# Patient Record
Sex: Male | Born: 1935
Health system: Southern US, Community
[De-identification: ages and names within clinical notes are randomized; demographics above are authoritative.]

## PROBLEM LIST (undated history)

## (undated) DIAGNOSIS — F419 Anxiety disorder, unspecified: Secondary | ICD-10-CM

## (undated) DIAGNOSIS — F32A Depression, unspecified: Secondary | ICD-10-CM

## (undated) DIAGNOSIS — F329 Major depressive disorder, single episode, unspecified: Secondary | ICD-10-CM

## (undated) DIAGNOSIS — N189 Chronic kidney disease, unspecified: Secondary | ICD-10-CM

## (undated) DIAGNOSIS — M1990 Unspecified osteoarthritis, unspecified site: Secondary | ICD-10-CM

---

## 2004-10-24 HISTORY — PX: EYE SURGERY: SHX253

## 2009-07-29 ENCOUNTER — Encounter: Admission: RE | Admit: 2009-07-29 | Discharge: 2009-07-29 | Payer: Self-pay | Admitting: Emergency Medicine

## 2009-12-18 ENCOUNTER — Encounter: Admission: RE | Admit: 2009-12-18 | Discharge: 2009-12-18 | Payer: Self-pay | Admitting: Emergency Medicine

## 2009-12-21 ENCOUNTER — Encounter: Admission: RE | Admit: 2009-12-21 | Discharge: 2009-12-21 | Payer: Self-pay | Admitting: Emergency Medicine

## 2010-02-08 ENCOUNTER — Encounter: Admission: RE | Admit: 2010-02-08 | Discharge: 2010-02-08 | Payer: Self-pay | Admitting: Emergency Medicine

## 2010-11-15 ENCOUNTER — Encounter: Payer: Self-pay | Admitting: Emergency Medicine

## 2011-11-25 ENCOUNTER — Ambulatory Visit
Admission: RE | Admit: 2011-11-25 | Discharge: 2011-11-25 | Disposition: A | Discharge status: Home / Self Care | Payer: Medicare Other | Source: Ambulatory Visit | Attending: Family Medicine | Admitting: Family Medicine

## 2011-11-25 ENCOUNTER — Other Ambulatory Visit: Payer: Self-pay | Admitting: Family Medicine

## 2011-11-25 DIAGNOSIS — M25579 Pain in unspecified ankle and joints of unspecified foot: Secondary | ICD-10-CM

## 2012-05-28 ENCOUNTER — Ambulatory Visit
Admission: RE | Admit: 2012-05-28 | Discharge: 2012-05-28 | Disposition: A | Discharge status: Home / Self Care | Payer: Medicare Other | Source: Ambulatory Visit | Attending: Family Medicine | Admitting: Family Medicine

## 2012-05-28 ENCOUNTER — Other Ambulatory Visit: Payer: Self-pay | Admitting: Family Medicine

## 2012-05-28 DIAGNOSIS — M545 Low back pain: Secondary | ICD-10-CM

## 2012-10-25 ENCOUNTER — Other Ambulatory Visit: Payer: Self-pay | Admitting: Family Medicine

## 2012-10-25 DIAGNOSIS — E041 Nontoxic single thyroid nodule: Secondary | ICD-10-CM

## 2012-11-30 ENCOUNTER — Ambulatory Visit
Admission: RE | Admit: 2012-11-30 | Discharge: 2012-11-30 | Disposition: A | Discharge status: Home / Self Care | Payer: Medicare Other | Source: Ambulatory Visit | Attending: Family Medicine | Admitting: Family Medicine

## 2012-11-30 ENCOUNTER — Other Ambulatory Visit: Payer: Self-pay | Admitting: Family Medicine

## 2012-11-30 DIAGNOSIS — R310 Gross hematuria: Secondary | ICD-10-CM

## 2012-12-03 ENCOUNTER — Ambulatory Visit
Admission: RE | Admit: 2012-12-03 | Discharge: 2012-12-03 | Disposition: A | Discharge status: Home / Self Care | Payer: Medicare Other | Source: Ambulatory Visit | Attending: Family Medicine | Admitting: Family Medicine

## 2012-12-14 ENCOUNTER — Encounter (HOSPITAL_COMMUNITY): Payer: Self-pay | Admitting: *Deleted

## 2012-12-14 ENCOUNTER — Other Ambulatory Visit: Payer: Self-pay | Admitting: Urology

## 2012-12-14 NOTE — Pre-Procedure Instructions (Signed)
Asked to bring blue folder the day of the procedure,insurance card,I.D. driver's license,wear comfortable clothing and have a driver for the day. Asked not to take Advil,Motrin,Ibuprofen,Aleve or any NSAIDS, Aspirin, or Toradol for 72 hours prior to procedure,  No vitamins or herbal medications 7 days prior to procedure. Will stop Aspirin. Instructed to take laxative per doctor's office instructions and eat a light dinner the evening before procedure.   To arrive at 1530  for lithotripsy procedure.

## 2012-12-26 ENCOUNTER — Encounter (HOSPITAL_COMMUNITY): Payer: Self-pay | Admitting: Pharmacy Technician

## 2013-01-02 NOTE — H&P (Signed)
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 Leslee Home. His PSA was 81 prompting a repeat PSA which was 102. He  underwent a prostate biopsy by Dr. Karoline Caldwell in [place] and was diagnosed with clinical stage T1c, Gleason 4+4 = 8 adenocarcinoma the prostate. At that time, his staging studies including a bone scan and CT scan were negative for metastatic prostate cancer. He did have an anterior mediastinal mass and thyroid nodule noted on his workup and underwent further evaluation including a biopsy which was nondiagnostic. It was not felt that this was likely malignant and he has been undergoing observation for these lesions. Due to his high-risk prostate cancer, he did elect to proceed with treatment.  He was treated with IMRT (75.6 Gy) by Dr. Verner Mould in [place] from November 2010 until January 2011. He also received androgen deprivation therapy with Lupron beginning in the summer of 2010 up until October 2012. He did have radiation proctitis acutely which subsequently resolved. He did have significant side effects related to androgen deprivation including a 40 pound weight gain, fatigue, and cognitive decline. His PSA did become undetectable following therapy. He elected to establish care with me for ongoing urologic followup in March 2013. After discussion, he elected to discontinue ADT at that time due to the side effects he was having.  2) Testosterone deficiency/bone health:  Current treatment: Vitamind D and calcium Last DEXA:  June 2013 (osteopenia)  3) BPH/LUTS: He has baseline urinary symptoms including urinary frequency, urgency, and hesitancy. The symptoms did progress slightly after radiation therapy. Current treatment: Tamsulosin  0.4 mg daily  Interval history:  He follows up today with a new complaint of a ureteral calculus. He apparently did have one kidney stone approximately 12 years ago which he was able to pass spontaneously. He presented to Dr. Duaine Dredge last week after developing gross hematuria. He denied any pain symptoms or fever. He underwent a CT scan which did demonstrate a 6 mm distal left ureteral calculus without hydronephrosis. He is since had no pain symptoms and again denies any fever, recurrent hematuria, or lower urinary tract symptoms.   Past Medical History Problems  1. History of  Arthritis V13.4 2. History of  Depression 311 3. History of  Hypercholesterolemia 272.0 4. History of  Prostate Cancer V10.46  Surgical History Problems  1. History of  Complete Colonoscopy 2. History of  Wrist Arthroscopy With Release Of Transverse Carpal Ligament Left 3. History of  Wrist Arthroscopy With Release Of Transverse Carpal Ligament Right  Current Meds 1. BuPROPion HCl ER (XL) 300 MG Oral Tablet Extended Release 24 Hour; Therapy: 06Jun2013 to 2. Fexofenadine HCl TABS; Therapy: (Recorded:05Mar2013) to 3. Hydrocodone-Acetaminophen 5-325 MG Oral Tablet; TAKE 1 TO 2 TABLETS EVERY 4 TO 6  HOURS AS NEEDED FOR PAIN.  NO MORE THAN 8 TABLETS PER DAY; Therapy: 11Feb2014 to  (Complete:14Feb2014); Last Rx:11Feb2014 4. Simvastatin TABS; Therapy: (Recorded:05Mar2013) to 5. Tamsulosin HCl 0.4 MG Oral Capsule; Therapy: (Recorded:05Mar2013) to 6. Vitamin D 1000 UNIT Oral Tablet; Therapy: (Recorded:05Mar2013) to 7. Zinc 50 MG Oral Tablet; Therapy: (Recorded:05Mar2013) to  Allergies Medication  1. No Known Drug Allergies  Family History Problems  1. Paternal history of  Nephrolithiasis 2. Paternal history of  Prostate Cancer V16.42  3. Paternal history of  Stroke Syndrome V17.1  Social History Problems  1. Marital History - Currently Married 2. Never A Smoker Denied  3. History of  Alcohol Use 4. History  of  Tobacco Use  Review of Systems  Genitourinary: hematuria.  Gastrointestinal: no nausea and no vomiting.  Constitutional: no fever.    Vitals Vital Signs [Data Includes: Last 1 Day]  18Feb2014 02:15PM  Blood Pressure: 162 / 78 Temperature: 98.4 F Heart Rate: 90  Physical Exam Constitutional: Well nourished and well developed . No acute distress.  ENT:. The ears and nose are normal in appearance.  Neck: The appearance of the neck is normal and no neck mass is present.  Pulmonary: No respiratory distress and normal respiratory rhythm and effort.  Cardiovascular:. No peripheral edema.  Abdomen: The abdomen is soft and nontender. No CVA tenderness.  Skin: Normal skin turgor, no visible rash and no visible skin lesions.  Neuro/Psych:. Mood and affect are appropriate.    Results/Data Urine [Data Includes: Last 1 Day]   18Feb2014  COLOR YELLOW   APPEARANCE CLEAR   SPECIFIC GRAVITY 1.010   pH 6.0   GLUCOSE NEG mg/dL  BILIRUBIN NEG   KETONE NEG mg/dL  BLOOD NEG   PROTEIN NEG mg/dL  UROBILINOGEN 0.2 mg/dL  NITRITE NEG   LEUKOCYTE ESTERASE NEG     I independently reviewed his medical records as well as his recent CT scan of the abdomen and pelvis from 12/03/12. This demonstrates a 6 mm distal left ureteral calculus without hydronephrosis.  I also independently reviewed his KUB x-ray today as well as a scout film from his prior CT scan. This does demonstrate persistent 6 mm calcification in the vicinity of the distal left ureter consistent with his known stone. Hounsfield units measurements are approximately 300 from his CT scan.   Plan Health Maintenance (V70.0)  1. UA With REFLEX  Done: 18Feb2014 01:50PM  Discussion/Summary  1. Left ureteral calculus: He currently is asymptomatic and without hydronephrosis. He will be managed with observation and medical expulsion therapy. He is tamsulosin at home which had previously taken for other purposes and will restart this medication  once nightly. He has been instructed to strain his urine and to notify me if he develops fever, severe pain, or nausea/vomiting. He also has been provided hydrocodone to take as needed if he develops pain. If he is unable to pass the stone over the next 3-4 weeks, we have discussed proceeding with definitive treatment. We discussed options including shockwave lithotripsy versus ureteroscopic therapy. After reviewing the pros and cons of each approach, he is most interested in tentatively been scheduled for shockwave lithotripsy. He understands the potential risks including but not limited to bleeding, infection, failure to fragment the stone, need for further procedures, et Karie Soda. He gives his informed consent to proceed in this fashion. He will notify me if he passes the stone prior to his planned procedure.  2. Prostate cancer: He will keep his regularly scheduled followup for surveillance in the spring.  3. BPH/LUTS: His PVR will be checked at his next scheduled visit.  Cc: Dr. Mosetta Putt     Signatures Electronically signed by : Heloise Purpura, M.D.; Dec 11 2012  7:08PM

## 2013-01-03 ENCOUNTER — Encounter (HOSPITAL_COMMUNITY)
Admission: RE | Disposition: A | Discharge status: Home / Self Care | Payer: Self-pay | Source: Ambulatory Visit | Attending: Urology

## 2013-01-03 ENCOUNTER — Ambulatory Visit (HOSPITAL_COMMUNITY)
Admission: RE | Admit: 2013-01-03 | Discharge: 2013-01-03 | Disposition: A | Discharge status: Home / Self Care | Payer: Medicare Other | Source: Ambulatory Visit | Attending: Urology | Admitting: Urology

## 2013-01-03 ENCOUNTER — Encounter (HOSPITAL_COMMUNITY): Payer: Self-pay | Admitting: *Deleted

## 2013-01-03 ENCOUNTER — Ambulatory Visit (HOSPITAL_COMMUNITY): Payer: Medicare Other

## 2013-01-03 DIAGNOSIS — N138 Other obstructive and reflux uropathy: Secondary | ICD-10-CM | POA: Insufficient documentation

## 2013-01-03 DIAGNOSIS — N401 Enlarged prostate with lower urinary tract symptoms: Secondary | ICD-10-CM | POA: Insufficient documentation

## 2013-01-03 DIAGNOSIS — N201 Calculus of ureter: Secondary | ICD-10-CM | POA: Insufficient documentation

## 2013-01-03 DIAGNOSIS — Z8546 Personal history of malignant neoplasm of prostate: Secondary | ICD-10-CM | POA: Insufficient documentation

## 2013-01-03 DIAGNOSIS — Z79899 Other long term (current) drug therapy: Secondary | ICD-10-CM | POA: Insufficient documentation

## 2013-01-03 DIAGNOSIS — E7800 Pure hypercholesterolemia, unspecified: Secondary | ICD-10-CM | POA: Insufficient documentation

## 2013-01-03 DIAGNOSIS — I10 Essential (primary) hypertension: Secondary | ICD-10-CM | POA: Insufficient documentation

## 2013-01-03 HISTORY — DX: Anxiety disorder, unspecified: F41.9

## 2013-01-03 HISTORY — DX: Chronic kidney disease, unspecified: N18.9

## 2013-01-03 HISTORY — DX: Unspecified osteoarthritis, unspecified site: M19.90

## 2013-01-03 HISTORY — DX: Major depressive disorder, single episode, unspecified: F32.9

## 2013-01-03 HISTORY — DX: Depression, unspecified: F32.A

## 2013-01-03 SURGERY — LITHOTRIPSY, ESWL
Anesthesia: LOCAL | Laterality: Left

## 2013-01-03 MED ORDER — CIPROFLOXACIN HCL 500 MG PO TABS
500.0000 mg | ORAL_TABLET | ORAL | Status: AC
  Administered 2013-01-03: 500 mg via ORAL
  Filled 2013-01-03: qty 1

## 2013-01-03 MED ORDER — DIPHENHYDRAMINE HCL 25 MG PO CAPS
25.0000 mg | ORAL_CAPSULE | ORAL | Status: AC
  Administered 2013-01-03: 25 mg via ORAL
  Filled 2013-01-03: qty 1

## 2013-01-03 MED ORDER — DIAZEPAM 5 MG PO TABS
10.0000 mg | ORAL_TABLET | ORAL | Status: AC
  Administered 2013-01-03: 10 mg via ORAL
  Filled 2013-01-03: qty 2

## 2013-01-03 MED ORDER — DEXTROSE-NACL 5-0.45 % IV SOLN
INTRAVENOUS | Status: DC
  Administered 2013-01-03: 16:00:00 via INTRAVENOUS

## 2013-01-03 NOTE — Interval H&P Note (Signed)
History and Physical Interval Note:  01/03/2013 6:40 PM  Jesse Hampton  has presented today for surgery, with the diagnosis of Left Distal Ureteral Calculus  The various methods of treatment have been discussed with the patient and family. After consideration of risks, benefits and other options for treatment, the patient has consented to  Procedure(s) with comments: EXTRACORPOREAL SHOCK WAVE LITHOTRIPSY (ESWL) (Left) - 75 mins requested for this case  [phone] home # [phone] cell # [facility] MEDICARE STATE   as a surgical intervention .  The patient's history has been reviewed, patient examined, no change in status, stable for surgery.  I have reviewed the patient's chart and labs.  Questions were answered to the patient's satisfaction.     BORDEN,LES

## 2013-01-03 NOTE — Op Note (Signed)
Please see written chart for operative note.

## 2013-01-03 NOTE — Progress Notes (Signed)
Left groin is reddened with out breakdown s/p ESWL.

## 2013-01-30 ENCOUNTER — Other Ambulatory Visit: Payer: Self-pay | Admitting: Urology

## 2013-04-01 ENCOUNTER — Other Ambulatory Visit: Payer: Medicare Other

## 2013-06-10 ENCOUNTER — Ambulatory Visit (HOSPITAL_COMMUNITY): Admission: RE | Admit: 2013-06-10 | Payer: Medicare Other | Source: Ambulatory Visit | Admitting: Urology

## 2013-06-10 ENCOUNTER — Encounter (HOSPITAL_COMMUNITY): Admission: RE | Payer: Self-pay | Source: Ambulatory Visit

## 2013-06-10 SURGERY — CYSTOSCOPY/RETROGRADE/URETEROSCOPY
Anesthesia: Choice | Laterality: Left

## 2013-08-06 ENCOUNTER — Other Ambulatory Visit: Payer: Self-pay | Admitting: Family Medicine

## 2013-08-07 ENCOUNTER — Other Ambulatory Visit: Payer: Self-pay | Admitting: Family Medicine

## 2013-08-07 DIAGNOSIS — R9389 Abnormal findings on diagnostic imaging of other specified body structures: Secondary | ICD-10-CM

## 2013-08-13 ENCOUNTER — Ambulatory Visit
Admission: RE | Admit: 2013-08-13 | Discharge: 2013-08-13 | Disposition: A | Discharge status: Home / Self Care | Payer: Medicare Other | Source: Ambulatory Visit | Attending: Family Medicine | Admitting: Family Medicine

## 2013-08-13 DIAGNOSIS — R935 Abnormal findings on diagnostic imaging of other abdominal regions, including retroperitoneum: Secondary | ICD-10-CM

## 2013-08-13 MED ORDER — IOHEXOL 350 MG/ML SOLN
100.0000 mL | Freq: Once | INTRAVENOUS | Status: AC | PRN
  Administered 2013-08-13: 100 mL via INTRAVENOUS

## 2014-11-13 ENCOUNTER — Ambulatory Visit (INDEPENDENT_AMBULATORY_CARE_PROVIDER_SITE_OTHER): Payer: Medicare Other | Admitting: Internal Medicine

## 2014-11-13 ENCOUNTER — Encounter: Payer: Self-pay | Admitting: Internal Medicine

## 2014-11-13 VITALS — BP 128/64 | HR 76 | Temp 97.8°F | Resp 18 | Ht 67.0 in | Wt 155.0 lb

## 2014-11-13 DIAGNOSIS — E785 Hyperlipidemia, unspecified: Secondary | ICD-10-CM

## 2014-11-13 DIAGNOSIS — J302 Other seasonal allergic rhinitis: Secondary | ICD-10-CM

## 2014-11-13 DIAGNOSIS — F32A Depression, unspecified: Secondary | ICD-10-CM

## 2014-11-13 DIAGNOSIS — N400 Enlarged prostate without lower urinary tract symptoms: Secondary | ICD-10-CM

## 2014-11-13 DIAGNOSIS — F329 Major depressive disorder, single episode, unspecified: Secondary | ICD-10-CM

## 2014-11-13 MED ORDER — TAMSULOSIN HCL 0.4 MG PO CAPS: 0.4000 mg | ORAL_CAPSULE | Freq: Every day | ORAL | Status: DC

## 2014-11-13 MED ORDER — BUPROPION HCL ER (XL) 300 MG PO TB24: 300.0000 mg | ORAL_TABLET | Freq: Every morning | ORAL | Status: DC

## 2014-11-13 MED ORDER — MECLIZINE HCL 25 MG PO TABS: 25.0000 mg | ORAL_TABLET | ORAL | Status: DC | PRN

## 2014-11-13 MED ORDER — SIMVASTATIN 20 MG PO TABS: 20.0000 mg | ORAL_TABLET | Freq: Every day | ORAL | Status: DC

## 2014-11-13 NOTE — Patient Instructions (Signed)
We have sent in your refills for your medications, if you have any problems or questions please feel free to call our office.   It was a pleasure meeting you today and I will see you back for your physical later this year.

## 2014-11-13 NOTE — Progress Notes (Signed)
Pre visit review using our clinic review tool, if applicable. No additional management support is needed unless otherwise documented below in the visit note. 

## 2014-11-16 DIAGNOSIS — N400 Enlarged prostate without lower urinary tract symptoms: Secondary | ICD-10-CM | POA: Insufficient documentation

## 2014-11-16 DIAGNOSIS — E785 Hyperlipidemia, unspecified: Secondary | ICD-10-CM | POA: Insufficient documentation

## 2014-11-16 DIAGNOSIS — F325 Major depressive disorder, single episode, in full remission: Secondary | ICD-10-CM | POA: Insufficient documentation

## 2014-11-16 DIAGNOSIS — J309 Allergic rhinitis, unspecified: Secondary | ICD-10-CM | POA: Insufficient documentation

## 2014-11-16 NOTE — Assessment & Plan Note (Signed)
He takes flomax and has done well with this. He still has some urination during the day but less night time symptoms.

## 2014-11-16 NOTE — Assessment & Plan Note (Signed)
He has been taking wellbutrin for some time and does well on it.

## 2014-11-16 NOTE — Assessment & Plan Note (Signed)
He takes allegra when needed for allergies. Not year round.

## 2014-11-16 NOTE — Assessment & Plan Note (Signed)
Will check lipid panel at physical later this year and continue simvastatin in the meantime.

## 2014-11-16 NOTE — Progress Notes (Signed)
   Subjective:    Patient ID: Jesse Hampton, male    DOB: 09/08/1930, 81 y.o. y.o.   MRN: [id number]  HPI The patient is an 81 YO man who is in reasonable health. He does have PMH of depression, BPH, hyperlipidemia, allergic rhinitis. He declines any chest pains, SOB, abdominal pain, constipation, diarrhea. He has been doing well on his regimen for some time and tries to stay current with his health. He knows he has had colon cancer screening in the last 10 years but unsure of the year. He knows he had a pneumonia shot but not sure he had the booster shot. He has no difficulty with his ADLs.   Review of Systems  Constitutional: Negative for fever, activity change, appetite change, fatigue and unexpected weight change.  HENT: Negative.   Respiratory: Negative for cough, chest tightness, shortness of breath and wheezing.   Cardiovascular: Negative for chest pain, palpitations and leg swelling.  Gastrointestinal: Negative for abdominal pain, diarrhea, constipation and abdominal distention.  Musculoskeletal: Negative.   Neurological: Negative.   Psychiatric/Behavioral: Negative.       Objective:   Physical Exam  Constitutional: He is oriented to person, place, and time. He appears well-developed and well-nourished.  HENT:  Head: Normocephalic and atraumatic.  Eyes: EOM are normal.  Neck: Normal range of motion.  Cardiovascular: Normal rate and regular rhythm.   Pulmonary/Chest: Effort normal and breath sounds normal. No respiratory distress. He has no wheezes. He has no rales.  Abdominal: Soft. Bowel sounds are normal. He exhibits no distension. There is no tenderness. There is no rebound.  Musculoskeletal: He exhibits no edema.  Neurological: He is alert and oriented to person, place, and time. Coordination normal.  Skin: Skin is warm and dry.   Filed Vitals:   11/13/14 1102  BP: 128/64  Pulse: 76  Temp: 97.8 F (36.6 C)  TempSrc: Oral  Resp: 18  Height: 5\' 7"  (1.702 m)  Weight:  155 lb (70.308 kg)  SpO2: 97%      Assessment & Plan:

## 2014-12-23 ENCOUNTER — Ambulatory Visit (INDEPENDENT_AMBULATORY_CARE_PROVIDER_SITE_OTHER): Payer: Medicare Other | Admitting: Internal Medicine

## 2014-12-23 VITALS — BP 128/68 | HR 77 | Temp 98.3°F | Resp 16 | Wt 157.0 lb

## 2014-12-23 DIAGNOSIS — L989 Disorder of the skin and subcutaneous tissue, unspecified: Secondary | ICD-10-CM

## 2014-12-23 NOTE — Patient Instructions (Signed)
The names of dermatology groups in town that do a good job:   [facility] [address] [phone]  [facility] [address] [phone]  You can schedule the physical at the front desk on the way out.

## 2014-12-23 NOTE — Progress Notes (Signed)
Pre visit review using our clinic review tool, if applicable. No additional management support is needed unless otherwise documented below in the visit note. 

## 2014-12-25 DIAGNOSIS — L299 Pruritus, unspecified: Secondary | ICD-10-CM | POA: Insufficient documentation

## 2014-12-25 NOTE — Progress Notes (Signed)
   Subjective:    Patient ID: Jesse Hampton, male    DOB: 04/04/1930, 81 y.o.   MRN: [id number]  HPI The patient is an 81 YO man coming in for 2 small skin lesions. They have been present for many years and have not changed significantly recently. One on his right cheek may have grown in the last year. He does not use good sun protection but does not go out much anymore. No discharge. Lesion on his right hand hits against things and bleeds and scabs but does not heal. Has not tried anything on them. Has not sought treatment for them before.   Review of Systems  Constitutional: Negative for fever, activity change, appetite change, fatigue and unexpected weight change.  HENT: Negative.   Respiratory: Negative for cough, chest tightness, shortness of breath and wheezing.   Cardiovascular: Negative for chest pain, palpitations and leg swelling.  Gastrointestinal: Negative for abdominal pain, diarrhea, constipation and abdominal distention.  Musculoskeletal: Negative.   Skin: Positive for color change.  Neurological: Negative.   Psychiatric/Behavioral: Negative.       Objective:   Physical Exam  Constitutional: He is oriented to person, place, and time. He appears well-developed and well-nourished.  HENT:  Head: Normocephalic and atraumatic.  Eyes: EOM are normal.  Neck: Normal range of motion.  Cardiovascular: Normal rate and regular rhythm.   Pulmonary/Chest: Effort normal and breath sounds normal. No respiratory distress. He has no wheezes. He has no rales.  Abdominal: Soft. Bowel sounds are normal. He exhibits no distension. There is no tenderness. There is no rebound.  Musculoskeletal: He exhibits no edema.  Neurological: He is alert and oriented to person, place, and time. Coordination normal.  Skin: Skin is warm and dry.  Lesion on the right cheek, non-cancerous appearing but with irregular borders, no color gradient, not large. Other lesion on the back of the right hand,  chronic irritation and hard to tell if possibly cancerous but more suspicious looking.    Filed Vitals:   12/23/14 1349  BP: 128/68  Pulse: 77  Temp: 98.3 F (36.8 C)  TempSrc: Oral  Resp: 16  Weight: 157 lb (71.215 kg)  SpO2: 96%       Assessment & Plan:

## 2014-12-25 NOTE — Assessment & Plan Note (Signed)
2 small lesions, one on face right side and one on right arm. Recommend he see dermatology to have removed. He wishes to find a dermatologist.

## 2015-02-19 IMAGING — CT CT ABD-PEL WO/W CM
2 of 9 series · 13 of 46 positions shown, 18 images · IV contrast (WATER & [ID] OMNI 350)
Comparison: CT 03/29/2013 at [HOSPITAL]

CLINICAL DATA: Possible pancreatic tail mass

EXAM:
CT ABDOMEN AND PELVIS WITHOUT AND WITH CONTRAST
TECHNIQUE: Multidetector CT imaging of the abdomen and pelvis was performed
without contrast material in one or both body regions, followed by
contrast material(s) and further sections in one or both body
regions.
CONTRAST:  100mL OMNIPAQUE IOHEXOL 350 MG/ML SOLN

[Series 3: arterial/portal venous · axial · arterial · 0.80mm/px · z∈[-350,+0]mm · 10 of 192 slices shown, 15 images]
[im 16/192  soft-tissue]
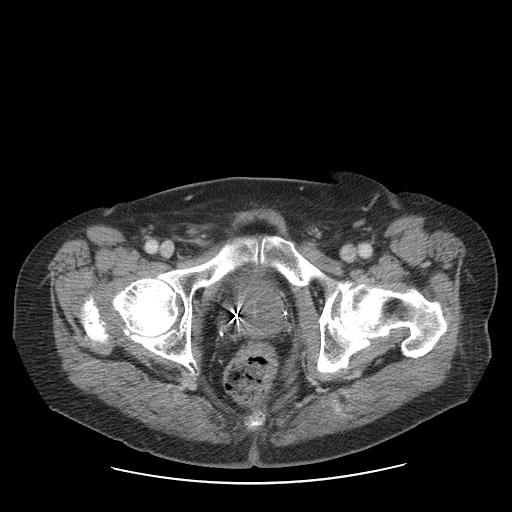
[im 16/192  bone]
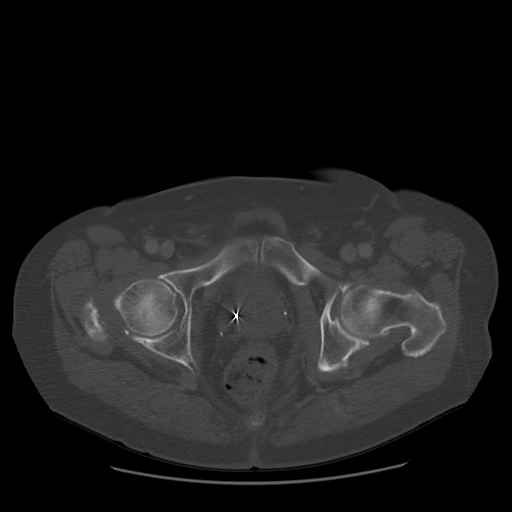
[im 32/192  soft-tissue]
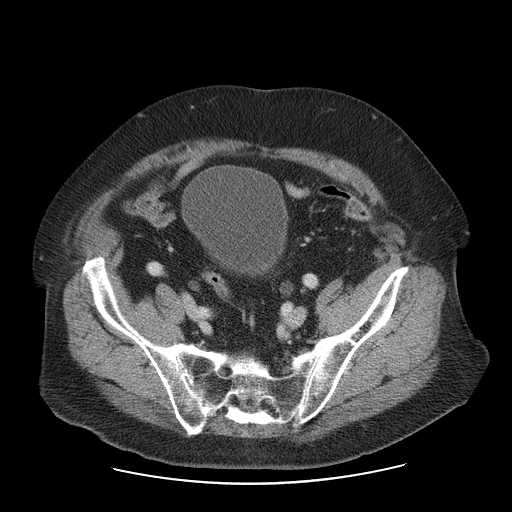
[im 64/192  soft-tissue]
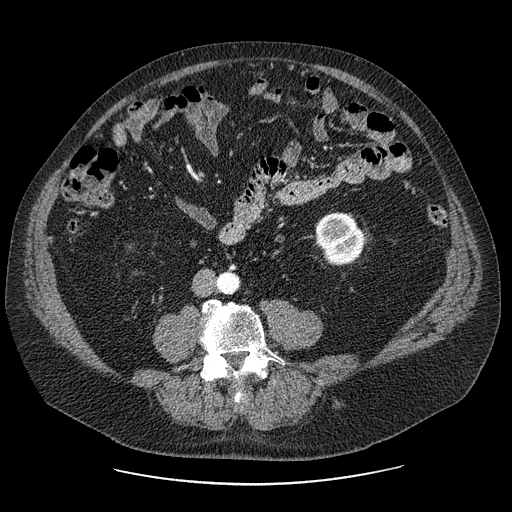
[im 80/192  soft-tissue]
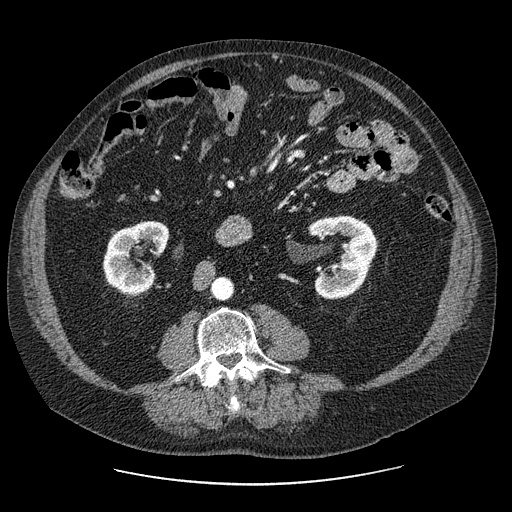
[im 96/192  soft-tissue]
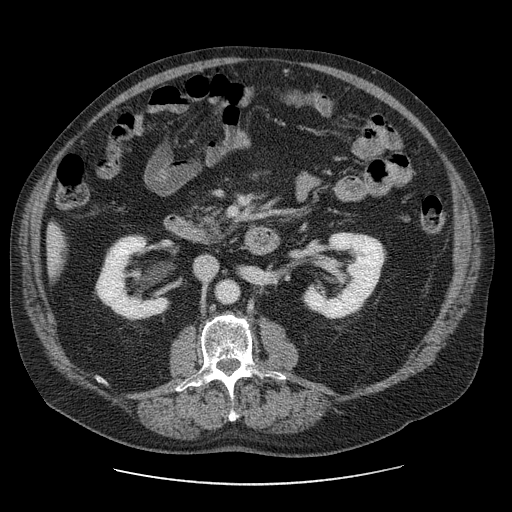
[im 112/192  soft-tissue]
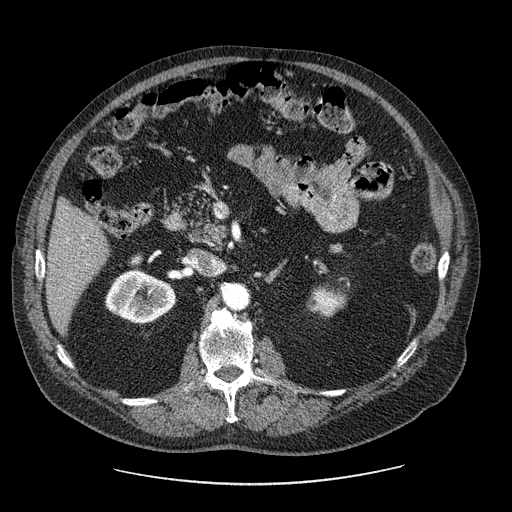
[im 128/192  soft-tissue]
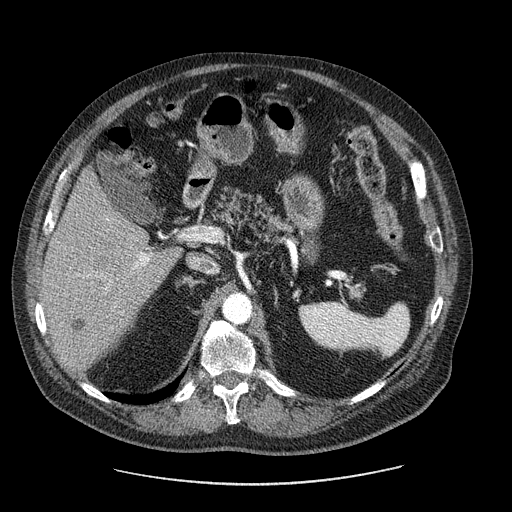
[im 128/192  lung]
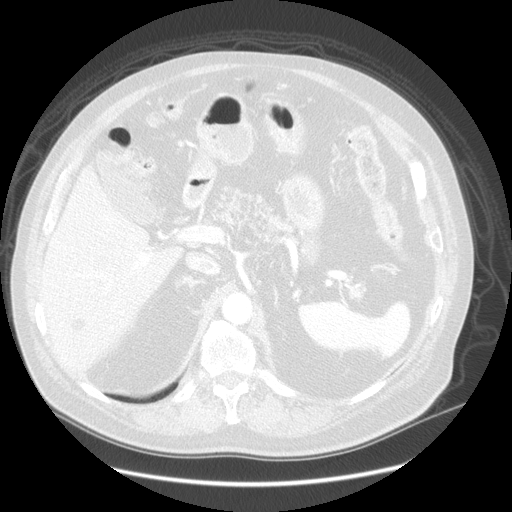
[im 144/192  lung]
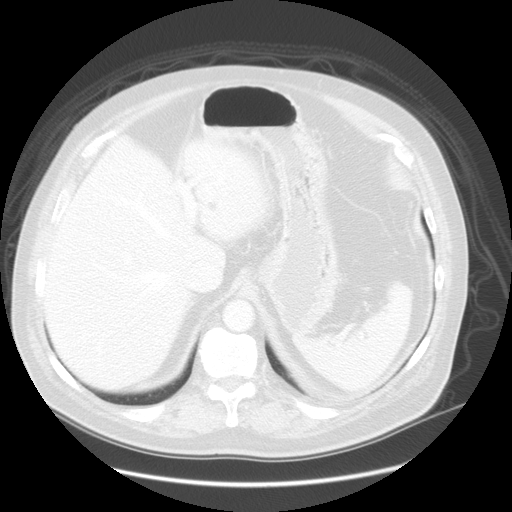
[im 160/192  soft-tissue]
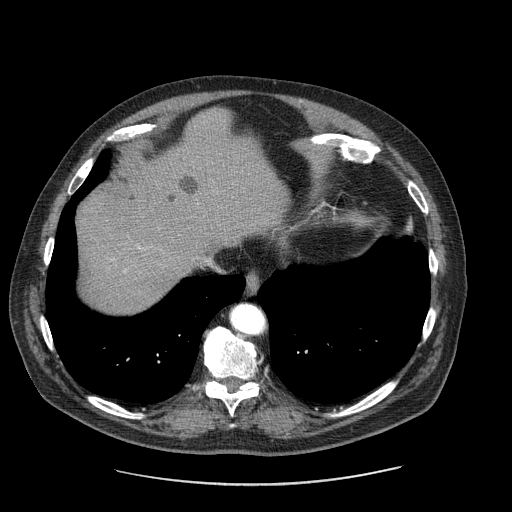
[im 160/192  lung]
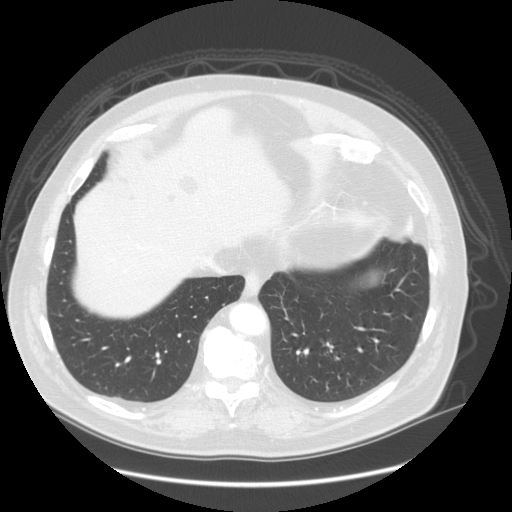
[im 176/192  soft-tissue]
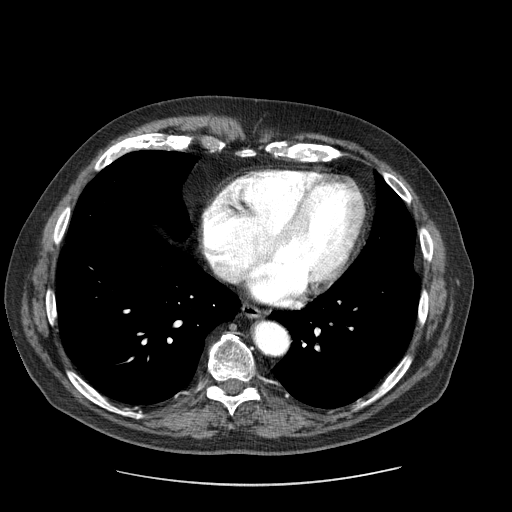
[im 176/192  lung]
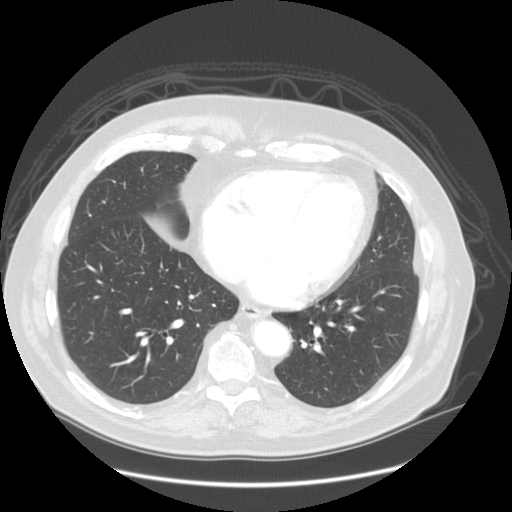
[im 176/192  bone]
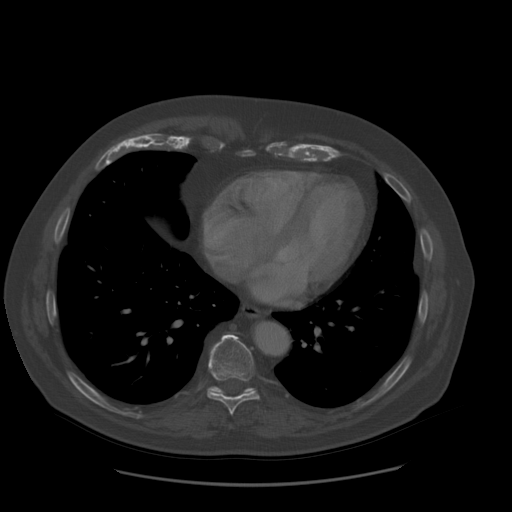

[Series 101: cor art · coronal · 0.80mm/px · 3 of 157 slices shown]
[im 40/157  soft-tissue]
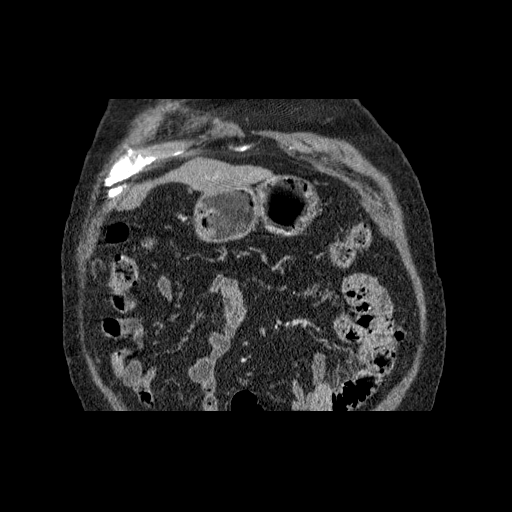
[im 79/157  soft-tissue]
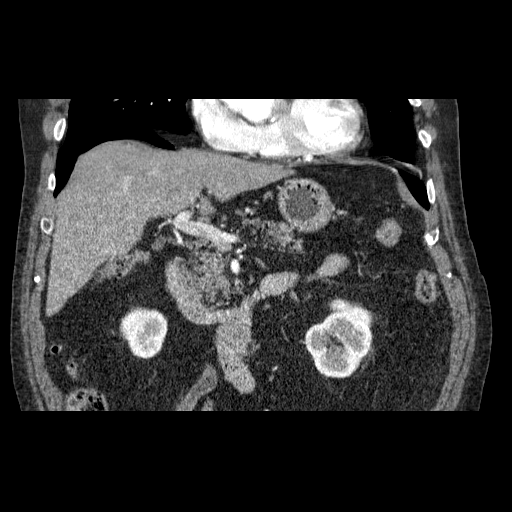
[im 118/157  soft-tissue]
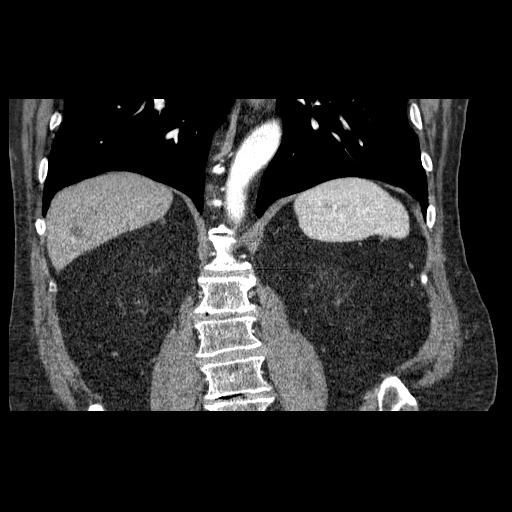

[13 of 46 positions shown; findings below may reference images not displayed]

BUN and creatinine were obtained on site at [HOSPITAL] at

[HOSPITAL].

Results:  BUN 21 mg/dL,  Creatinine 0.8 mg/dL.
FINDINGS: The lung bases are clear. Trace pericardial fluid. 1.5 cm cyst at
the dome of the liver is again noted. Too small to characterize
posterior segment right hepatic lobe and other hepatic hypodensities
most compatible with cysts are also stable. Mild image degradation
due to patient motion. Gallbladder, adrenal glands, spleen, and
right kidney are unremarkable. 2 mm nonobstructing left mid renal
calculus image 33. The pancreas is partly fatty infiltrated. This is
suspect there is asymmetric fatty infiltration most prominent at the
proximal pancreas with relatively less fatty infiltration at the
pancreatic tail. No surrounding stranding or fluid collection is
identified and there is no pancreatic ductal dilatation. The
pancreatic parenchyma enhances homogeneously. Although there is
minimal subjective nodularity to the pancreatic tail, this is felt
to most likely reflect relatively less fatty infiltration that
elsewhere in the pancreas and no measurable mass lesion is
identifiable separate from the pancreatic tissue.

Bowel and bladder are unremarkable. Appendix is normal.
Brachytherapy seeds are noted in the pelvis. Right-sided varicocele
partly visualized. Small fat containing left inguinal hernia. No
lymphadenopathy or ascites. There is mild impression upon the base
of the bladder by the prostate. Minimal asymmetrical wall thickening
is noted at the left aspect of the bladder trigone. 4 mm distal left
ureteral calculus again identified image 138 series 4. There is mild
proximal left ureteral fullness without overt hydronephrosis.

Degenerative changes are noted in the spine. No lytic or sclerotic
osseous lesion or acute osseous abnormality.
IMPRESSION: Although there is nodularity at the tail of the pancreas, this is
felt to most likely reflect asymmetrically decreased fatty
infiltration in this area as compared to the proximal pancreas. No
measurable mass is identified. No significant change from prior
examinations dating back to the least recent similar comparison exam
12/03/2012.

Persistent 4 mm distal left ureteral calculus with mild proximal
hydroureter reidentified, and mild thickening/possible edema at the
left lateral bladder trigone region.

## 2015-03-25 ENCOUNTER — Telehealth: Payer: Self-pay

## 2015-03-25 NOTE — Telephone Encounter (Signed)
Call to the patient and LVM for AWV / left message to see if the patient could come in around 8:45 for his AWV and then he will see Dr. Dorise HissKollar at 9:30am as planned.

## 2015-04-01 NOTE — Telephone Encounter (Signed)
Call back to Mr. Jesse Hampton to fup on VM regarding apt tomorrow with Dr. Dorise HissKollar. The patient stated he would like an AWV; but he did not call back and apt slot was taken. Will come in at scheduled time with Dr. Dorise HissKollar and Dr. Dorise HissKollar may complete the AWV or he can reschedule with me at a later time.

## 2015-04-02 ENCOUNTER — Other Ambulatory Visit (INDEPENDENT_AMBULATORY_CARE_PROVIDER_SITE_OTHER): Payer: Medicare Other

## 2015-04-02 ENCOUNTER — Encounter: Payer: Self-pay | Admitting: Internal Medicine

## 2015-04-02 ENCOUNTER — Ambulatory Visit (INDEPENDENT_AMBULATORY_CARE_PROVIDER_SITE_OTHER): Payer: Medicare Other | Admitting: Internal Medicine

## 2015-04-02 VITALS — BP 130/62 | HR 75 | Temp 98.2°F | Resp 12 | Ht 68.0 in | Wt 163.0 lb

## 2015-04-02 DIAGNOSIS — E041 Nontoxic single thyroid nodule: Secondary | ICD-10-CM | POA: Insufficient documentation

## 2015-04-02 DIAGNOSIS — Z23 Encounter for immunization: Secondary | ICD-10-CM | POA: Diagnosis not present

## 2015-04-02 DIAGNOSIS — F324 Major depressive disorder, single episode, in partial remission: Secondary | ICD-10-CM | POA: Diagnosis not present

## 2015-04-02 DIAGNOSIS — F325 Major depressive disorder, single episode, in full remission: Secondary | ICD-10-CM

## 2015-04-02 DIAGNOSIS — Z299 Encounter for prophylactic measures, unspecified: Secondary | ICD-10-CM

## 2015-04-02 DIAGNOSIS — Z8546 Personal history of malignant neoplasm of prostate: Secondary | ICD-10-CM | POA: Diagnosis not present

## 2015-04-02 DIAGNOSIS — Z418 Encounter for other procedures for purposes other than remedying health state: Secondary | ICD-10-CM

## 2015-04-02 DIAGNOSIS — E785 Hyperlipidemia, unspecified: Secondary | ICD-10-CM

## 2015-04-02 DIAGNOSIS — Z0000 Encounter for general adult medical examination without abnormal findings: Secondary | ICD-10-CM

## 2015-04-02 LAB — COMPREHENSIVE METABOLIC PANEL
ALT: 17 U/L (ref 0–53)
AST: 21 U/L (ref 0–37)
Albumin: 4.4 g/dL (ref 3.5–5.2)
Alkaline Phosphatase: 47 U/L (ref 39–117)
BUN: 25 mg/dL — ABNORMAL HIGH (ref 6–23)
CO2: 31 mEq/L (ref 19–32)
Calcium: 9.1 mg/dL (ref 8.4–10.5)
Chloride: 102 mEq/L (ref 96–112)
Creatinine, Ser: 0.89 mg/dL (ref 0.40–1.50)
GFR: 86.8 mL/min (ref >60.00)
Glucose, Bld: 93 mg/dL (ref 70–99)
Potassium: 4.3 mEq/L (ref 3.5–5.1)
Sodium: 137 mEq/L (ref 135–145)
Total Bilirubin: 0.6 mg/dL (ref 0.2–1.2)
Total Protein: 6.7 g/dL (ref 6.0–8.3)

## 2015-04-02 LAB — LIPID PANEL
Cholesterol: 152 mg/dL (ref 0–200)
HDL: 52.8 mg/dL (ref >39.00)
LDL CALC: 81 mg/dL (ref 0–99)
NONHDL: 99.2
TRIGLYCERIDES: 92 mg/dL (ref 0.0–149.0)
Total CHOL/HDL Ratio: 3
VLDL: 18.4 mg/dL (ref 0.0–40.0)

## 2015-04-02 LAB — PSA: PSA: 0.16 ng/mL (ref 0.10–4.00)

## 2015-04-02 LAB — TSH: TSH: 1.49 u[IU]/mL (ref 0.35–4.50)

## 2015-04-02 LAB — T4, FREE: Free T4: 0.77 ng/dL (ref 0.60–1.60)

## 2015-04-02 MED ORDER — FLUOCINONIDE 0.05 % EX CREA: 1.0000 "application " | TOPICAL_CREAM | Freq: Two times a day (BID) | CUTANEOUS | Status: DC

## 2015-04-02 NOTE — Assessment & Plan Note (Signed)
Found 2010 with whole body screening for prostate cancer. Biopsy done inconclusive. Checking TSH and free T4 today.

## 2015-04-02 NOTE — Assessment & Plan Note (Signed)
BP at goal, aged out of colonoscopy. Checking labs today. Walks daily and advanced directives in place. 10 year screening recommendations given to the patient at visit. Non-smoker and advised not to start. Talked about sun safety with his fair skin and he does cover when outside. Active and diet good.

## 2015-04-02 NOTE — Patient Instructions (Signed)
We have given you the pneumonia booster shot today and will call you back with your lab results.   Come back in about 6-12 months or sooner if you have new problems or questions.   Keep up the good work with staying healthy.  Health Maintenance A healthy lifestyle and preventative care can promote health and wellness.  Maintain regular health, dental, and eye exams.  Eat a healthy diet. Foods like vegetables, fruits, whole grains, low-fat dairy products, and lean protein foods contain the nutrients you need and are low in calories. Decrease your intake of foods high in solid fats, added sugars, and salt. Get information about a proper diet from your health care provider, if necessary.  Regular physical exercise is one of the most important things you can do for your health. Most adults should get at least 150 minutes of moderate-intensity exercise (any activity that increases your heart rate and causes you to sweat) each week. In addition, most adults need muscle-strengthening exercises on 2 or more days a week.   Maintain a healthy weight. The body mass index (BMI) is a screening tool to identify possible weight problems. It provides an estimate of body fat based on height and weight. Your health care provider can find your BMI and can help you achieve or maintain a healthy weight. For males 20 years and older:  A BMI below 18.5 is considered underweight.  A BMI of 18.5 to 24.9 is normal.  A BMI of 25 to 29.9 is considered overweight.  A BMI of 30 and above is considered obese.  Maintain normal blood lipids and cholesterol by exercising and minimizing your intake of saturated fat. Eat a balanced diet with plenty of fruits and vegetables. Blood tests for lipids and cholesterol should begin at age 51 and be repeated every 5 years. If your lipid or cholesterol levels are high, you are over age 65, or you are at high risk for heart disease, you may need your cholesterol levels checked more  frequently.Ongoing high lipid and cholesterol levels should be treated with medicines if diet and exercise are not working.  If you smoke, find out from your health care provider how to quit. If you do not use tobacco, do not start.  Lung cancer screening is recommended for adults aged 55-80 years who are at high risk for developing lung cancer because of a history of smoking. A yearly low-dose CT scan of the lungs is recommended for people who have at least a 30-pack-year history of smoking and are current smokers or have quit within the past 15 years. A pack year of smoking is smoking an average of 1 pack of cigarettes a day for 1 year (for example, a 30-pack-year history of smoking could mean smoking 1 pack a day for 30 years or 2 packs a day for 15 years). Yearly screening should continue until the smoker has stopped smoking for at least 15 years. Yearly screening should be stopped for people who develop a health problem that would prevent them from having lung cancer treatment.  If you choose to drink alcohol, do not have more than 2 drinks per day. One drink is considered to be 12 oz (360 mL) of beer, 5 oz (150 mL) of wine, or 1.5 oz (45 mL) of liquor.  Avoid the use of street drugs. Do not share needles with anyone. Ask for help if you need support or instructions about stopping the use of drugs.  High blood pressure causes heart disease  and increases the risk of stroke. Blood pressure should be checked at least every 1-2 years. Ongoing high blood pressure should be treated with medicines if weight loss and exercise are not effective.  If you are 29-25 years old, ask your health care provider if you should take aspirin to prevent heart disease.  Diabetes screening involves taking a blood sample to check your fasting blood sugar level. This should be done once every 3 years after age 29 if you are at a normal weight and without risk factors for diabetes. Testing should be considered at a younger  age or be carried out more frequently if you are overweight and have at least 1 risk factor for diabetes.  Colorectal cancer can be detected and often prevented. Most routine colorectal cancer screening begins at the age of 1 and continues through age 35. However, your health care provider may recommend screening at an earlier age if you have risk factors for colon cancer. On a yearly basis, your health care provider may provide home test kits to check for hidden blood in the stool. A small camera at the end of a tube may be used to directly examine the colon (sigmoidoscopy or colonoscopy) to detect the earliest forms of colorectal cancer. Talk to your health care provider about this at age 109 when routine screening begins. A direct exam of the colon should be repeated every 5-10 years through age 51, unless early forms of precancerous polyps or small growths are found.  People who are at an increased risk for hepatitis B should be screened for this virus. You are considered at high risk for hepatitis B if:  You were born in a country where hepatitis B occurs often. Talk with your health care provider about which countries are considered high risk.  Your parents were born in a high-risk country and you have not received a shot to protect against hepatitis B (hepatitis B vaccine).  You have HIV or AIDS.  You use needles to inject street drugs.  You live with, or have sex with, someone who has hepatitis B.  You are a man who has sex with other men (MSM).  You get hemodialysis treatment.  You take certain medicines for conditions like cancer, organ transplantation, and autoimmune conditions.  Hepatitis C blood testing is recommended for all people born from [birth year] through 1963 and any individual with known risk factors for hepatitis C.  Healthy men should no longer receive prostate-specific antigen (PSA) blood tests as part of routine cancer screening. Talk to your health care provider about  prostate cancer screening.  Testicular cancer screening is not recommended for adolescents or adult males who have no symptoms. Screening includes self-exam, a health care provider exam, and other screening tests. Consult with your health care provider about any symptoms you have or any concerns you have about testicular cancer.  Practice safe sex. Use condoms and avoid high-risk sexual practices to reduce the spread of sexually transmitted infections (STIs).  You should be screened for STIs, including gonorrhea and chlamydia if:  You are sexually active and are younger than 24 years.  You are older than 24 years, and your health care provider tells you that you are at risk for this type of infection.  Your sexual activity has changed since you were last screened, and you are at an increased risk for chlamydia or gonorrhea. Ask your health care provider if you are at risk.  If you are at risk of being infected with  HIV, it is recommended that you take a prescription medicine daily to prevent HIV infection. This is called pre-exposure prophylaxis (PrEP). You are considered at risk if:  You are a man who has sex with other men (MSM).  You are a heterosexual man who is sexually active with multiple partners.  You take drugs by injection.  You are sexually active with a partner who has HIV.  Talk with your health care provider about whether you are at high risk of being infected with HIV. If you choose to begin PrEP, you should first be tested for HIV. You should then be tested every 3 months for as long as you are taking PrEP.  Use sunscreen. Apply sunscreen liberally and repeatedly throughout the day. You should seek shade when your shadow is shorter than you. Protect yourself by wearing long sleeves, pants, a wide-brimmed hat, and sunglasses year round whenever you are outdoors.  Tell your health care provider of new moles or changes in moles, especially if there is a change in shape or  color. Also, tell your health care provider if a mole is larger than the size of a pencil eraser.  A one-time screening for abdominal aortic aneurysm (AAA) and surgical repair of large AAAs by ultrasound is recommended for men aged 1-75 years who are current or former smokers.  Stay current with your vaccines (immunizations). Document Released: 04/07/2008 Document Revised: 10/15/2013 Document Reviewed: 03/07/2011 [facility] Patient Information 2015 [place], [place]. This information is not intended to replace advice given to you by your health care provider. Make sure you discuss any questions you have with your health care provider.

## 2015-04-02 NOTE — Progress Notes (Signed)
Pre visit review using our clinic review tool, if applicable. No additional management support is needed unless otherwise documented below in the visit note. 

## 2015-04-02 NOTE — Assessment & Plan Note (Signed)
Continue wellbutrin and doing well.

## 2015-04-02 NOTE — Progress Notes (Signed)
   Subjective:    Patient ID: Jesse Hampton, male    DOB: 1930-08-06, 81 y.o.   MRN: [id number]  HPI Here for medicare wellness, no new complaints. Please see A/P for status and treatment of chronic medical problems.   Diet: heart healthy Physical activity: walks 2 miles per day Depression/mood screen: negative Hearing: intact to whispered voice with hearing aids Visual acuity: grossly normal with glasses, performs annual eye exam  ADLs: capable Fall risk: none Home safety: good Cognitive evaluation: intact to orientation, naming, recall and repetition EOL planning: adv directives discussed and in place  I have personally reviewed and have noted 1. The patient's medical and social history - reviewed today no changes 2. Their use of alcohol, tobacco or illicit drugs 3. Their current medications and supplements 4. The patient's functional ability including ADL's, fall risks, home safety risks and hearing or visual impairment. 5. Diet and physical activities 6. Evidence for depression or mood disorders 7. Care team reviewed and updated (available in snapshot)  Review of Systems  Constitutional: Negative for fever, activity change, appetite change, fatigue and unexpected weight change.  HENT: Negative.   Eyes: Negative.   Respiratory: Negative for cough, chest tightness, shortness of breath and wheezing.   Cardiovascular: Negative for chest pain, palpitations and leg swelling.  Gastrointestinal: Negative for abdominal pain, diarrhea, constipation and abdominal distention.  Musculoskeletal: Negative.   Skin: Negative.   Neurological: Negative.   Psychiatric/Behavioral: Negative.       Objective:   Physical Exam  Constitutional: He is oriented to person, place, and time. He appears well-developed and well-nourished.  HENT:  Head: Normocephalic and atraumatic.  Eyes: EOM are normal.  Cardiovascular: Normal rate and regular rhythm.   Pulmonary/Chest: Effort normal and  breath sounds normal.  Abdominal: Soft. Bowel sounds are normal. He exhibits no distension. There is no tenderness. There is no rebound.  Musculoskeletal: He exhibits no edema.  Good PT pulses bilaterally  Neurological: He is alert and oriented to person, place, and time. Coordination normal.  Skin: Skin is warm and dry.  Psychiatric: He has a normal mood and affect.   Filed Vitals:   04/02/15 0926  BP: 130/62  Pulse: 75  Temp: 98.2 F (36.8 C)  TempSrc: Oral  Resp: 12  Height: 5\' 8"  (1.727 m)  Weight: 163 lb (73.936 kg)  SpO2: 98%      Assessment & Plan:  Prevnar given at visit

## 2015-04-02 NOTE — Assessment & Plan Note (Signed)
Checking lipid panel. On zocor without problems, adjust as indicated.

## 2015-04-07 ENCOUNTER — Telehealth: Payer: Self-pay

## 2015-04-07 NOTE — Telephone Encounter (Signed)
-----   Message from Judie Bonus, MD sent at 04/06/2015  8:08 AM EDT ----- Please let him know that the thyroid levels are normal, PSA is 0.16, his cholesterol is great and his kidneys and liver are normal.

## 2015-04-07 NOTE — Telephone Encounter (Signed)
Advised patient of dr Loann Quill note, patient wanted me to let dr Dorise Hiss know that he did have significant arm swelling with his last pneumonia shot, but he is ok now and he also wanted me to let dr Dorise Hiss know that he said thank you for everything

## 2015-07-07 ENCOUNTER — Ambulatory Visit (INDEPENDENT_AMBULATORY_CARE_PROVIDER_SITE_OTHER): Payer: Medicare Other | Admitting: Internal Medicine

## 2015-07-07 ENCOUNTER — Encounter: Payer: Self-pay | Admitting: Internal Medicine

## 2015-07-07 VITALS — BP 140/68 | HR 69 | Temp 98.4°F | Resp 16 | Wt 156.0 lb

## 2015-07-07 DIAGNOSIS — M5416 Radiculopathy, lumbar region: Secondary | ICD-10-CM | POA: Diagnosis not present

## 2015-07-07 MED ORDER — TRAMADOL HCL 50 MG PO TABS: 50.0000 mg | ORAL_TABLET | Freq: Three times a day (TID) | ORAL | Status: DC | PRN

## 2015-07-07 MED ORDER — TIZANIDINE HCL 2 MG PO CAPS: ORAL_CAPSULE | ORAL | Status: DC

## 2015-07-07 NOTE — Progress Notes (Signed)
Pre visit review using our clinic review tool, if applicable. No additional management support is needed unless otherwise documented below in the visit note. 

## 2015-07-07 NOTE — Patient Instructions (Signed)
The Physical Therapy referral will be scheduled and you'll be notified of the time.Please call the Referral Co-Ordinator @ [phone] if you have not been notified of appointment time within 7-10 days  .The best exercises for the low back include freestyle swimming, stretch aerobics, and yoga.

## 2015-07-07 NOTE — Progress Notes (Signed)
   Subjective:    Patient ID: Jesse Hampton, male    DOB: 21-Jul-1930, 81 y.o.   MRN: [id number]  HPI   He describes pain in the right hip and right lower extremity for the last week. He travels to [place], [place] once a month to help care for his brother who has Alzheimer's. He was sleeping in a motel on a soft mattress and began to have the pain after awakening one morning. He had a similar episode in 2013. Physical therapy that time was of benefit. X-rays dated 05/28/12 revealed degenerative disc disease with loss of disc space as well as spondylosis. Images were reviewed with him.  The pain is described as up to level IV-V on a 10 scale. It increases as he walks. It extends to the anterior thigh and anterior shin above the ankle.  He had taken a narcotic pain medication with possible benefit. Heat and Aleve do not help.  He denies any other neuromuscular symptoms, constitutional, or genitourinary symptoms.   Review of Systems Fever, chills, sweats, or unexplained weight loss not present. No significant headaches. Mental status change or memory loss denied. Blurred vision , diplopia or vision loss absent. Vertigo, near syncope or imbalance denied. There is no numbness, tingling, or weakness in extremities.   No loss of control of bladder or bowels. Radicular type pain absent. No seizure stigmata. Dysuria, pyuria, hematuria, frequency, nocturia or polyuria are denied.     Objective:   Physical Exam  Pertinent or positive findings include: He has dramatic DIP osteoarthritic hand changes. He is able to walk on his heels and toes but does exhibit a slight limp on the right. Straight leg raising is negative bilaterally.  General appearance :adequately nourished; in no distress.  Eyes: No conjunctival inflammation or scleral icterus is present.  Oral exam:  Lips and gums are healthy appearing.There is no oropharyngeal erythema or exudate noted. Dental hygiene is good.  Heart:   Normal rate and regular rhythm. S1 and S2 normal without gallop, murmur, click, rub or other extra sounds    Lungs:Chest clear to auscultation; no wheezes, rhonchi,rales ,or rubs present.No increased work of breathing.   Abdomen: bowel sounds normal, soft and non-tender without masses, organomegaly or hernias noted.  No guarding or rebound. No flank tenderness to percussion.  Vascular : all pulses equal ; no bruits present.  Skin:Warm & dry.  Intact without suspicious lesions or rashes ; no tenting or jaundice   Lymphatic: No lymphadenopathy is noted about the head, neck, axilla.   Neuro: Strength, tone & DTRs normal.     Assessment & Plan:  #1 lumbar radiculopathy @ L2-4 Plan: imaging not indicated in the absence of trauma Tramadol every 6-8 hours as needed and Zanaflex at bedtime as needed. Physical therapy referral

## 2015-07-16 ENCOUNTER — Ambulatory Visit (INDEPENDENT_AMBULATORY_CARE_PROVIDER_SITE_OTHER): Payer: Medicare Other | Admitting: Internal Medicine

## 2015-07-16 ENCOUNTER — Encounter: Payer: Self-pay | Admitting: Internal Medicine

## 2015-07-16 VITALS — BP 120/70 | HR 79 | Temp 98.3°F | Resp 16 | Ht 67.0 in | Wt 152.1 lb

## 2015-07-16 DIAGNOSIS — M5416 Radiculopathy, lumbar region: Secondary | ICD-10-CM | POA: Diagnosis not present

## 2015-07-16 MED ORDER — MELOXICAM 15 MG PO TABS: 15.0000 mg | ORAL_TABLET | Freq: Every day | ORAL | Status: DC

## 2015-07-16 NOTE — Progress Notes (Signed)
Pre visit review using our clinic review tool, if applicable. No additional management support is needed unless otherwise documented below in the visit note. 

## 2015-07-16 NOTE — Progress Notes (Signed)
   Subjective:    Patient ID: Jesse Hampton, male    DOB: June 16, 1930, 81 y.o.   MRN: [id number]  HPI The patient is an 81 YO man coming in for back pain. He has been struggling with it for 2-3 weeks. He was seen in the clinic for it and given tramadol and tizanidine which made him feel nauseous so he has not taken again. He is taking aleve over the counter which helps some but wants to take a prescription strength of this. He is also in PT which is helping some but slowly. He has had this problem several other times in his life which resolved slowly on their own. He did this on a trip to see his brother in [place] with dementia during the long car ride.   Review of Systems  Constitutional: Negative for fever, activity change, appetite change, fatigue and unexpected weight change.  Respiratory: Negative for cough, chest tightness, shortness of breath and wheezing.   Cardiovascular: Negative for chest pain, palpitations and leg swelling.  Gastrointestinal: Negative for abdominal pain, diarrhea, constipation and abdominal distention.  Musculoskeletal: Positive for back pain and arthralgias. Negative for gait problem.  Skin: Negative.   Neurological: Negative.   Psychiatric/Behavioral: Negative.       Objective:   Physical Exam  Constitutional: He is oriented to person, place, and time. He appears well-developed and well-nourished.  HENT:  Head: Normocephalic and atraumatic.  Eyes: EOM are normal.  Cardiovascular: Normal rate and regular rhythm.   Pulmonary/Chest: Effort normal and breath sounds normal.  Abdominal: Soft. Bowel sounds are normal. He exhibits no distension. There is no tenderness. There is no rebound.  Musculoskeletal: He exhibits no edema.  Pain in the midthoracic and lumbar region around the spine and paraspinally.   Neurological: He is alert and oriented to person, place, and time. Coordination normal.  Skin: Skin is warm and dry.  Psychiatric: He has a normal mood and  affect.   Filed Vitals:   07/16/15 1329  BP: 120/70  Pulse: 79  Temp: 98.3 F (36.8 C)  TempSrc: Oral  Resp: 16  Height:  (1.702 m)  Weight: 152 lb 1.9 oz (69.001 kg)  SpO2: 98%      Assessment & Plan:

## 2015-07-16 NOTE — Patient Instructions (Signed)
We have sent in the prescription for the meloxicam (mobic) for the pain in the back. You can take it once a day with food to help with the pain.   Keep using the heat and keeping up with physical therapy and the back will improve.

## 2015-07-18 NOTE — Assessment & Plan Note (Addendum)
Rx for mobic for short term only. He will continue with PT and try heat on the area as well. No red flag signs to require imaging.

## 2015-12-07 ENCOUNTER — Telehealth: Payer: Self-pay | Admitting: *Deleted

## 2015-12-07 MED ORDER — TAMSULOSIN HCL 0.4 MG PO CAPS: 0.4000 mg | ORAL_CAPSULE | Freq: Every day | ORAL | Status: DC

## 2015-12-07 NOTE — Telephone Encounter (Signed)
Left msg on triage needing refills sent to [facility] on his Flomax. Sent electronically....Raechel Chute

## 2015-12-24 ENCOUNTER — Other Ambulatory Visit: Payer: Self-pay | Admitting: Internal Medicine

## 2016-02-01 ENCOUNTER — Other Ambulatory Visit: Payer: Self-pay | Admitting: Internal Medicine

## 2016-02-10 ENCOUNTER — Telehealth: Payer: Self-pay | Admitting: *Deleted

## 2016-02-10 NOTE — Telephone Encounter (Signed)
Received call pt states he is needing refill on Canasa 1000 mg was originally rx by his old PCP. Needing rx sent to [facility]/[place]...Raechel Chute/lmb

## 2016-02-11 ENCOUNTER — Telehealth: Payer: Self-pay | Admitting: Internal Medicine

## 2016-02-11 MED ORDER — MESALAMINE 1000 MG RE SUPP: 1000.0000 mg | Freq: Every day | RECTAL | Status: DC

## 2016-02-11 NOTE — Telephone Encounter (Signed)
Sent in

## 2016-02-11 NOTE — Telephone Encounter (Signed)
Called pt no answer LMOM RTC.../lmb 

## 2016-02-11 NOTE — Telephone Encounter (Signed)
Notified pt wife rx sent to pharmacy.../lmb 

## 2016-02-11 NOTE — Telephone Encounter (Signed)
What is he using this for? This is a medicine for inflammatory bowel disease which I am not aware that he has.

## 2016-02-11 NOTE — Telephone Encounter (Signed)
Pt return call back he stated that he had prostate cancer and had to have 42 radiation treatments. From the results from that from time to time he has bleeding from the rectal area, and his previous MD rx that for him to use as needed. He doesn't take everyday only when he have that flare up which he is having now, and need medication ASAP...Raechel Chute/lmb

## 2016-02-11 NOTE — Addendum Note (Signed)
Addended by: Hillard DankerRAWFORD, Korban Shearer A on: 02/11/2016 10:37 AM   Modules accepted: Orders

## 2016-04-14 ENCOUNTER — Ambulatory Visit (INDEPENDENT_AMBULATORY_CARE_PROVIDER_SITE_OTHER): Payer: Medicare Other | Admitting: Internal Medicine

## 2016-04-14 ENCOUNTER — Encounter: Payer: Self-pay | Admitting: Internal Medicine

## 2016-04-14 VITALS — BP 150/60 | HR 71 | Temp 98.3°F | Resp 14 | Ht 66.5 in | Wt 163.0 lb

## 2016-04-14 DIAGNOSIS — H8102 Meniere's disease, left ear: Secondary | ICD-10-CM

## 2016-04-14 DIAGNOSIS — Z0000 Encounter for general adult medical examination without abnormal findings: Secondary | ICD-10-CM

## 2016-04-14 DIAGNOSIS — Z8546 Personal history of malignant neoplasm of prostate: Secondary | ICD-10-CM

## 2016-04-14 MED ORDER — MECLIZINE HCL 25 MG PO TABS: 25.0000 mg | ORAL_TABLET | ORAL | Status: DC | PRN

## 2016-04-14 NOTE — Patient Instructions (Signed)
We will put in the labs,

## 2016-04-14 NOTE — Progress Notes (Signed)
Pre visit review using our clinic review tool, if applicable. No additional management support is needed unless otherwise documented below in the visit note. 

## 2016-04-15 DIAGNOSIS — H8109 Meniere's disease, unspecified ear: Secondary | ICD-10-CM | POA: Insufficient documentation

## 2016-04-15 NOTE — Assessment & Plan Note (Signed)
Does have stable hearing decrease left ear. Having flare of vertigo and refilled his meclizine today. Talked to him about the need for extra safety and avoiding sudden head movements to help with symptoms.

## 2016-04-15 NOTE — Progress Notes (Signed)
   Subjective:    Patient ID: Jesse Hampton, male    DOB: 10/30/1929, 82 y.o.   MRN: [id number]  HPI The patient is an 82 YO man coming in for follow up of his meniere's disease. He has had it since his 2520s and every once in a while it flares and gives his vertigo. He has been using meclizine for it the last week or so. He is having more problems in the morning with getting up and occasionally with walking he needs to hold on due to sudden vertigo. The room spins. He does not feel pre-syncopal and has not passed out. No flu or viral illness before.   Review of Systems  Constitutional: Negative for fever, activity change, appetite change, fatigue and unexpected weight change.  HENT: Positive for hearing loss. Negative for ear discharge and ear pain.   Respiratory: Negative for cough, chest tightness, shortness of breath and wheezing.   Cardiovascular: Negative for chest pain, palpitations and leg swelling.  Gastrointestinal: Negative for abdominal pain, diarrhea, constipation and abdominal distention.  Musculoskeletal: Positive for back pain and arthralgias. Negative for gait problem.  Skin: Negative.   Psychiatric/Behavioral: Negative.       Objective:   Physical Exam  Constitutional: He is oriented to person, place, and time. He appears well-developed and well-nourished.  HENT:  Head: Normocephalic and atraumatic.  Right Ear: External ear normal.  Left Ear: External ear normal.  Bilateral TM normal  Eyes: EOM are normal.  Cardiovascular: Normal rate and regular rhythm.   Pulmonary/Chest: Effort normal and breath sounds normal.  Abdominal: Soft. Bowel sounds are normal. He exhibits no distension. There is no tenderness. There is no rebound.  Musculoskeletal: He exhibits no edema.  Neurological: He is alert and oriented to person, place, and time. Coordination normal.  Skin: Skin is warm and dry.  Psychiatric: He has a normal mood and affect.   Filed Vitals:   04/14/16 1611    BP: 150/60  Pulse: 71  Temp: 98.3 F (36.8 C)  TempSrc: Oral  Resp: 14  Height: 5' 6.5" (1.689 m)  Weight: 163 lb (73.936 kg)  SpO2: 98%      Assessment & Plan:

## 2016-04-28 ENCOUNTER — Other Ambulatory Visit: Payer: Self-pay | Admitting: Internal Medicine

## 2016-04-29 ENCOUNTER — Other Ambulatory Visit: Payer: Self-pay | Admitting: Internal Medicine

## 2016-05-04 ENCOUNTER — Telehealth: Payer: Self-pay | Admitting: Emergency Medicine

## 2016-05-12 ENCOUNTER — Other Ambulatory Visit (INDEPENDENT_AMBULATORY_CARE_PROVIDER_SITE_OTHER): Payer: Medicare Other

## 2016-05-12 DIAGNOSIS — Z0000 Encounter for general adult medical examination without abnormal findings: Secondary | ICD-10-CM

## 2016-05-12 DIAGNOSIS — Z8546 Personal history of malignant neoplasm of prostate: Secondary | ICD-10-CM

## 2016-05-12 LAB — COMPREHENSIVE METABOLIC PANEL
ALBUMIN: 4.6 g/dL (ref 3.5–5.2)
ALT: 26 U/L (ref 0–53)
AST: 25 U/L (ref 0–37)
Alkaline Phosphatase: 49 U/L (ref 39–117)
BUN: 29 mg/dL — AB (ref 6–23)
CHLORIDE: 103 meq/L (ref 96–112)
CO2: 25 meq/L (ref 19–32)
Calcium: 9.2 mg/dL (ref 8.4–10.5)
Creatinine, Ser: 0.97 mg/dL (ref 0.40–1.50)
GFR: 78.38 mL/min (ref >60.00)
Glucose, Bld: 102 mg/dL — ABNORMAL HIGH (ref 70–99)
POTASSIUM: 4.6 meq/L (ref 3.5–5.1)
SODIUM: 139 meq/L (ref 135–145)
Total Bilirubin: 0.7 mg/dL (ref 0.2–1.2)
Total Protein: 7.2 g/dL (ref 6.0–8.3)

## 2016-05-12 LAB — LIPID PANEL
CHOL/HDL RATIO: 3
CHOLESTEROL: 170 mg/dL (ref 0–200)
HDL: 62.6 mg/dL (ref >39.00)
LDL CALC: 93 mg/dL (ref 0–99)
NonHDL: 107.01
TRIGLYCERIDES: 69 mg/dL (ref 0.0–149.0)
VLDL: 13.8 mg/dL (ref 0.0–40.0)

## 2016-05-12 LAB — T4, FREE: Free T4: 1.01 ng/dL (ref 0.60–1.60)

## 2016-05-12 LAB — TSH: TSH: 2.12 u[IU]/mL (ref 0.35–4.50)

## 2016-05-12 LAB — PSA: PSA: 0.1 ng/mL (ref 0.10–4.00)

## 2016-05-13 ENCOUNTER — Other Ambulatory Visit: Payer: Self-pay | Admitting: Internal Medicine

## 2016-05-25 ENCOUNTER — Other Ambulatory Visit: Payer: Self-pay | Admitting: Internal Medicine

## 2016-06-01 NOTE — Telephone Encounter (Signed)
d 

## 2016-06-13 ENCOUNTER — Other Ambulatory Visit: Payer: Self-pay | Admitting: Internal Medicine

## 2016-08-04 ENCOUNTER — Other Ambulatory Visit: Payer: Self-pay | Admitting: Internal Medicine

## 2016-08-11 ENCOUNTER — Encounter: Payer: Self-pay | Admitting: Geriatric Medicine

## 2016-09-20 ENCOUNTER — Ambulatory Visit (INDEPENDENT_AMBULATORY_CARE_PROVIDER_SITE_OTHER): Payer: Medicare Other | Admitting: Internal Medicine

## 2016-09-20 ENCOUNTER — Encounter: Payer: Self-pay | Admitting: Internal Medicine

## 2016-09-20 VITALS — BP 142/60 | HR 68 | Temp 98.3°F | Resp 14 | Ht 67.0 in | Wt 169.0 lb

## 2016-09-20 DIAGNOSIS — F325 Major depressive disorder, single episode, in full remission: Secondary | ICD-10-CM

## 2016-09-20 DIAGNOSIS — Z23 Encounter for immunization: Secondary | ICD-10-CM | POA: Diagnosis not present

## 2016-09-20 DIAGNOSIS — Z820 Family history of epilepsy and other diseases of the nervous system: Secondary | ICD-10-CM | POA: Diagnosis not present

## 2016-09-20 MED ORDER — SIMVASTATIN 20 MG PO TABS: 20.0000 mg | ORAL_TABLET | Freq: Every day | ORAL | 3 refills | Status: DC

## 2016-09-20 MED ORDER — BETAMETHASONE DIPROPIONATE 0.05 % EX CREA: TOPICAL_CREAM | Freq: Two times a day (BID) | CUTANEOUS | 0 refills | Status: DC

## 2016-09-20 NOTE — Assessment & Plan Note (Signed)
He is concerned about keeping his memory good without decline. We talked about the possibility of putting him on aricept now and he would like to wait and monitor his memory closely and continue to talk about this.

## 2016-09-20 NOTE — Addendum Note (Signed)
Addended by: Conception ChancyOBERSON, Iolani Twilley R on: 09/20/2016 11:47 AM   Modules accepted: Orders

## 2016-09-20 NOTE — Progress Notes (Signed)
Pre visit review using our clinic review tool, if applicable. No additional management support is needed unless otherwise documented below in the visit note. 

## 2016-09-20 NOTE — Progress Notes (Signed)
   Subjective:    Patient ID: Jesse Hampton, male    DOB: 10/21/1930, [age] y.o.   MRN: [id number]  HPI The patient is an 82 YO man coming in for concerns about his memory. He has a brother with dementia and this is worrying him about his memory. He knows that most of the medicines do not improve the memory but do preserve it. This is very important to him. He denies major problems with his memory. Still drives and handles his own finances. Some minor forgetfulness which he compares to his friends in his age range and feels his is minor in comparison.   Review of Systems  Constitutional: Negative.   Respiratory: Negative for cough, chest tightness and shortness of breath.   Cardiovascular: Negative for chest pain, palpitations and leg swelling.  Gastrointestinal: Negative for abdominal distention, abdominal pain, constipation, diarrhea, nausea and vomiting.  Neurological: Negative.        Forgetfulness.  Psychiatric/Behavioral: Negative.       Objective:   Physical Exam  Constitutional: He is oriented to person, place, and time. He appears well-developed and well-nourished.  HENT:  Head: Normocephalic and atraumatic.  Eyes: EOM are normal.  Neck: Normal range of motion.  Cardiovascular: Normal rate and regular rhythm.   Pulmonary/Chest: Effort normal and breath sounds normal. No respiratory distress. He has no wheezes. He has no rales.  Abdominal: Soft. Bowel sounds are normal. He exhibits no distension. There is no tenderness. There is no rebound.  Musculoskeletal: He exhibits no edema.  Neurological: He is alert and oriented to person, place, and time. Coordination normal.  Skin: Skin is warm and dry.  Psychiatric: He has a normal mood and affect.   Vitals:   09/20/16 1101 09/20/16 1122  BP: (!) 162/60 (!) 142/60  Pulse: 68   Resp: 14   Temp: 98.3 F (36.8 C)   TempSrc: Oral   SpO2: 98%   Weight: 169 lb (76.7 kg)   Height: 5\' 7"  (1.702 m)       Assessment & Plan:    Pneumonia 23 given at visit.

## 2016-09-20 NOTE — Assessment & Plan Note (Signed)
Still doing well on the wellbutrin and he knows that if this worsens we can adjust his regimen.

## 2016-09-20 NOTE — Patient Instructions (Signed)
We have given you the pneumonia shot and this is the last one you need.   We have sent in a cream for your legs which you can use twice daily.   We have sent in the refills.

## 2016-10-30 ENCOUNTER — Other Ambulatory Visit: Payer: Self-pay | Admitting: Internal Medicine

## 2017-04-08 ENCOUNTER — Other Ambulatory Visit: Payer: Self-pay | Admitting: Internal Medicine

## 2017-04-17 ENCOUNTER — Other Ambulatory Visit: Payer: Self-pay | Admitting: Internal Medicine

## 2017-05-10 ENCOUNTER — Ambulatory Visit (INDEPENDENT_AMBULATORY_CARE_PROVIDER_SITE_OTHER): Payer: Medicare Other | Admitting: Family Medicine

## 2017-05-10 ENCOUNTER — Encounter: Payer: Self-pay | Admitting: Family Medicine

## 2017-05-10 VITALS — BP 128/70 | HR 70 | Temp 98.0°F | Ht 67.0 in | Wt 148.0 lb

## 2017-05-10 DIAGNOSIS — E041 Nontoxic single thyroid nodule: Secondary | ICD-10-CM

## 2017-05-10 DIAGNOSIS — R42 Dizziness and giddiness: Secondary | ICD-10-CM

## 2017-05-10 DIAGNOSIS — M545 Low back pain: Secondary | ICD-10-CM

## 2017-05-10 DIAGNOSIS — E785 Hyperlipidemia, unspecified: Secondary | ICD-10-CM

## 2017-05-10 DIAGNOSIS — Z8546 Personal history of malignant neoplasm of prostate: Secondary | ICD-10-CM | POA: Diagnosis not present

## 2017-05-10 DIAGNOSIS — G8929 Other chronic pain: Secondary | ICD-10-CM | POA: Diagnosis not present

## 2017-05-10 MED ORDER — MELOXICAM 15 MG PO TABS: 15.0000 mg | ORAL_TABLET | Freq: Every day | ORAL | 0 refills | Status: DC | PRN

## 2017-05-10 MED ORDER — MECLIZINE HCL 25 MG PO TABS
25.0000 mg | ORAL_TABLET | Freq: Two times a day (BID) | ORAL | 0 refills | Status: AC | PRN
Start: 2017-05-10 — End: ?

## 2017-05-10 NOTE — Progress Notes (Signed)
Subjective:    Patient ID: Jesse ForthRonald Fritz Hampton, male    DOB: 04/21/1930, 83 y.o.   MRN: [id number]  HPI This is an 83 yo male who presents today for medication refills and labs.  He reports he is doing well, denies any chest pain, SOB, nausea/vomiting/diarrhea/constipation.  He take meclizine 1-2x per month for dizziness and takes meloxicam 1-2x/ month for back pain. Has had PT in the past for back issues, continues to do exercises and walks his dog 1.5 miles per day.  He is concerned about family history of alzheimers disease. His brother in [place] has end stage alzheimer's and he drives to see him 1-2x/ month. He has not noticed any difficulty with directions or performing daily tasks. Occasionally forgets something.   Has had long standing mild leg edema. Resolves with elevating legs. Wears compression socks on long drives.   Has history of thyroid nodules on scan 8 years ago. Does not wish to have ultrasound, just labs.   Past Medical History:  Diagnosis Date  . Anxiety   . Arthritis    hands,  . Chronic kidney disease    kidney stones  . Depression    Past Surgical History:  Procedure Laterality Date  . EYE SURGERY  2006   cartaract bil.   Family History  Problem Relation Age of Onset  . Stroke Mother   . Alzheimer's disease Mother   . Cancer Father        prostate  . Stroke Father   . Alzheimer's disease Brother    Social History  Substance Use Topics  . Smoking status: Never Smoker  . Smokeless tobacco: Never Used  . Alcohol use No      Review of Systems  Constitutional: Negative for fatigue, fever and unexpected weight change.  Respiratory: Negative for cough and shortness of breath.   Cardiovascular: Positive for leg swelling. Negative for chest pain and palpitations.  Gastrointestinal: Negative.   Musculoskeletal: Positive for back pain (occasional).  Psychiatric/Behavioral: Negative for dysphoric mood. The patient is not nervous/anxious.    Per  HPI     Objective:   Physical Exam Physical Exam  Constitutional: Oriented to person, place, and time. He appears well-developed and well-nourished.  HENT:  Head: Normocephalic and atraumatic.  Eyes: Conjunctivae are normal.  Neck: Normal range of motion. Neck supple. No thyromegaly or irregularity palpated.  Cardiovascular: Normal rate, regular rhythm and normal heart sounds.  Trace pretibial edema.  Pulmonary/Chest: Effort normal and breath sounds normal.  Musculoskeletal: Normal range of motion.  Neurological: Alert and oriented to person, place, and time.  Skin: Skin is warm and dry.  Psychiatric: Normal mood and affect. Behavior is normal. Judgment and thought content normal.  Vitals reviewed.     BP 128/70 (BP Location: Left Arm, Patient Position: Sitting, Cuff Size: Normal)   Pulse 70   Temp 98 F (36.7 C) (Oral)   Ht 5\' 7"  (1.702 m)   Wt 148 lb (67.1 kg)   SpO2 99%   BMI 23.18 kg/m      Assessment & Plan:  1. H/O prostate cancer - CBC with Differential/Platelet; Future - Comprehensive metabolic panel; Future - PSA; Future  2. Dizziness - occurs rarely, good relief with meclizine - CBC with Differential/Platelet; Future - Comprehensive metabolic panel; Future - TSH; Future - meclizine (ANTIVERT) 25 MG tablet; Take 1 tablet (25 mg total) by mouth 2 (two) times daily as needed for dizziness.  Dispense: 30 tablet; Refill: 0  3. Thyroid  nodule - TSH; Future  4. Hyperlipidemia, unspecified hyperlipidemia type - Lipid panel; Future  5. Chronic low back pain, unspecified back pain laterality, with sciatica presence unspecified - meloxicam (MOBIC) 15 MG tablet; Take 1 tablet (15 mg total) by mouth daily as needed for pain.  Dispense: 30 tablet; Refill: 0  - follow up in 6 months Olean Ree, FNP-BC  [facility] [place], [facility]  05/10/2017 11:15 AM

## 2017-05-10 NOTE — Progress Notes (Signed)
Pre visit review using our clinic review tool, if applicable. No additional management support is needed unless otherwise documented below in the visit note. 

## 2017-05-10 NOTE — Patient Instructions (Signed)
6 

## 2017-05-11 ENCOUNTER — Other Ambulatory Visit (INDEPENDENT_AMBULATORY_CARE_PROVIDER_SITE_OTHER): Payer: Medicare Other

## 2017-05-11 DIAGNOSIS — R42 Dizziness and giddiness: Secondary | ICD-10-CM

## 2017-05-11 DIAGNOSIS — Z8546 Personal history of malignant neoplasm of prostate: Secondary | ICD-10-CM | POA: Diagnosis not present

## 2017-05-11 DIAGNOSIS — E041 Nontoxic single thyroid nodule: Secondary | ICD-10-CM

## 2017-05-11 DIAGNOSIS — E785 Hyperlipidemia, unspecified: Secondary | ICD-10-CM | POA: Diagnosis not present

## 2017-05-11 LAB — COMPREHENSIVE METABOLIC PANEL
ALBUMIN: 4.3 g/dL (ref 3.5–5.2)
ALT: 17 U/L (ref 0–53)
AST: 19 U/L (ref 0–37)
Alkaline Phosphatase: 50 U/L (ref 39–117)
BILIRUBIN TOTAL: 0.6 mg/dL (ref 0.2–1.2)
BUN: 31 mg/dL — ABNORMAL HIGH (ref 6–23)
CALCIUM: 9 mg/dL (ref 8.4–10.5)
CHLORIDE: 102 meq/L (ref 96–112)
CO2: 28 mEq/L (ref 19–32)
CREATININE: 0.85 mg/dL (ref 0.40–1.50)
GFR: 91.06 mL/min (ref >60.00)
Glucose, Bld: 96 mg/dL (ref 70–99)
Potassium: 4.2 mEq/L (ref 3.5–5.1)
Sodium: 137 mEq/L (ref 135–145)
Total Protein: 6.4 g/dL (ref 6.0–8.3)

## 2017-05-11 LAB — CBC WITH DIFFERENTIAL/PLATELET
BASOS PCT: 0.2 % (ref 0.0–3.0)
Basophils Absolute: 0 10*3/uL (ref 0.0–0.1)
EOS ABS: 0.3 10*3/uL (ref 0.0–0.7)
EOS PCT: 7.4 % — AB (ref 0.0–5.0)
HEMATOCRIT: 38.1 % — AB (ref 39.0–52.0)
HEMOGLOBIN: 12.6 g/dL — AB (ref 13.0–17.0)
LYMPHS PCT: 21.5 % (ref 12.0–46.0)
Lymphs Abs: 1 10*3/uL (ref 0.7–4.0)
MCHC: 33 g/dL (ref 30.0–36.0)
MCV: 92.7 fl (ref 78.0–100.0)
MONOS PCT: 8.8 % (ref 3.0–12.0)
Monocytes Absolute: 0.4 10*3/uL (ref 0.1–1.0)
NEUTROS ABS: 2.9 10*3/uL (ref 1.4–7.7)
Neutrophils Relative %: 62.1 % (ref 43.0–77.0)
Platelets: 172 10*3/uL (ref 150.0–400.0)
RBC: 4.11 Mil/uL — ABNORMAL LOW (ref 4.22–5.81)
RDW: 13.6 % (ref 11.5–15.5)
WBC: 4.7 10*3/uL (ref 4.0–10.5)

## 2017-05-11 LAB — LIPID PANEL
CHOL/HDL RATIO: 3
CHOLESTEROL: 137 mg/dL (ref 0–200)
HDL: 52 mg/dL (ref >39.00)
LDL CALC: 67 mg/dL (ref 0–99)
NonHDL: 84.52
Triglycerides: 88 mg/dL (ref 0.0–149.0)
VLDL: 17.6 mg/dL (ref 0.0–40.0)

## 2017-05-11 LAB — PSA: PSA: 0.11 ng/mL (ref 0.10–4.00)

## 2017-05-11 LAB — TSH: TSH: 1.65 u[IU]/mL (ref 0.35–4.50)

## 2017-07-05 LAB — PSA: PSA: 0.12

## 2017-07-14 ENCOUNTER — Encounter: Payer: Self-pay | Admitting: Internal Medicine

## 2017-07-14 LAB — CHG URINALYSIS NONAUTO W/O SCOPE
BILIRUBIN: NEGATIVE
Blood: NEGATIVE
GLUCOSE: NEGATIVE
Ketone: NEGATIVE
Leukocytes, UA: NEGATIVE — AB
Nitrites: NEGATIVE
PH: 6
PROTEIN: NEGATIVE
Specific Gravity: 1.015
Urobilinogen, UA: NORMAL

## 2017-07-14 NOTE — Progress Notes (Signed)
Abstracted and sent to scan  

## 2017-08-08 ENCOUNTER — Ambulatory Visit (INDEPENDENT_AMBULATORY_CARE_PROVIDER_SITE_OTHER): Payer: Medicare Other | Admitting: Nurse Practitioner

## 2017-08-08 ENCOUNTER — Telehealth: Payer: Self-pay | Admitting: Internal Medicine

## 2017-08-08 ENCOUNTER — Encounter: Payer: Self-pay | Admitting: Nurse Practitioner

## 2017-08-08 VITALS — BP 130/66 | HR 83 | Temp 98.6°F | Ht 67.0 in | Wt 142.0 lb

## 2017-08-08 DIAGNOSIS — R112 Nausea with vomiting, unspecified: Secondary | ICD-10-CM

## 2017-08-08 DIAGNOSIS — R1113 Vomiting of fecal matter: Secondary | ICD-10-CM

## 2017-08-08 MED ORDER — ONDANSETRON HCL 4 MG PO TABS: 4.0000 mg | ORAL_TABLET | Freq: Three times a day (TID) | ORAL | 0 refills | Status: AC | PRN

## 2017-08-08 NOTE — Telephone Encounter (Signed)
[facility] - Client TELEPHONE ADVICE RECORD [facility]  Patient Name: Jesse Hampton  DOB: Jan 25, 1930    Initial Comment Caller states c/o vertigo and vomiting fecal matter since eating yogurt yesterday that had been in his refrigerator while the power was out. Sent to Urgent per charge nurse.    Nurse Assessment  Nurse: Odis Luster, RN, Bjorn Loser Date/Time (Eastern Time): 08/08/2017 9:13:31 AM  Confirm and document reason for call. If symptomatic, describe symptoms. ---Caller states c/o vertigo and vomiting fecal matter since eating yogurt yesterday that had been in his refrigerator while the power was out. Sent to Urgent per charge nurse.  Does the patient have any new or worsening symptoms? ---Yes  Will a triage be completed? ---Yes  Related visit to physician within the last 2 weeks? ---N/A  Does the PT have any chronic conditions? (i.e. diabetes, asthma, etc.) ---Yes  List chronic conditions. ---hx prostate cancer;  Is this a behavioral health or substance abuse call? ---No     Guidelines    Guideline Title Affirmed Question Affirmed Notes  Vomiting [1] Drinking very little AND [2] dehydration suspected (e.g., no urine > 12 hours, very dry mouth, very lightheaded)    Final Disposition User   Go to ED Now (or PCP triage) Odis Luster, RN, Bjorn Loser    Comments  Appt scheduled with Alysia Penna, NP at the [facility] office for 10:30am today.   Referrals  REFERRED TO PCP OFFICE   Caller Disagree/Comply Comply  Caller Understands Yes  PreDisposition InappropriateToAsk

## 2017-08-08 NOTE — Patient Instructions (Addendum)
Since patient report fecal matter in stool, I recommend ABD x-ray to rule out partial obstruction. He declined stating he does not think he has a bowel obstruction despite reporting that he had fecal matter in emesis yesterday.  Call office if no improvement in 24-48hrs.  Return to office if symptoms worsen or if develops any new symptoms.  Maintain clear liquid diet for 24 hrs then advance as tolerated. Add diary products last.  Clear Liquid Diet A clear liquid diet means that you only have liquids that you can see through. You do not eat any food on this diet. Most people need to follow this diet for only a short time. What do I need to know about this diet?  A clear liquid is a liquid that you can see through when you hold it up to a light.  This diet does not give you all the nutrients that you need. Choose a variety of the liquids that your doctor says you can drink on this diet. That way, you will get as many nutrients as possible.  If you are not sure whether you can have certain items, ask your doctor. What can I have?  Water and flavored water.  Fruit juices that do not have pulp, such as cranberry juice and apple juice.  Tea and coffee without milk or cream.  Clear bouillon or broth.  Broth-based soups that have been strained.  Flavored gelatins.  Honey.  Sugar water.  Frozen ice or frozen ice pops that do not have any milk, yogurt, fruit pieces, or fruit pulp in them.  Clear sodas.  Clear sports drinks. The items listed above may not be a complete list of recommended liquids. Contact your food and nutrition expert (dietitian) for more options. What can I not have?  Juices that have pulp.  Milk.  Cream or cream-based soups.  Yogurt. The items listed above may not be a complete list of liquids to avoid. Contact your food and nutrition expert for more information. Summary  A clear liquid diet is a diet that includes only liquids that you can see  through.  The goal of this diet is to help you recover.  Make sure to avoid liquids with milk, cream, or pulp while you are on this diet. This information is not intended to replace advice given to you by your health care provider. Make sure you discuss any questions you have with your health care provider. Document Released: 09/22/2008 Document Revised: 05/23/2016 Document Reviewed: 09/06/2013 Elsevier Interactive Patient Education  2017 [employer].

## 2017-08-08 NOTE — Progress Notes (Signed)
Subjective:  Patient ID: Jesse Hampton, male    DOB: May 03, 1930  Age: 83 y.o. MRN: [id number]  CC: Emesis (vomiting 3 days ago/ had yogert during the power out/ notice feces 1 time when vomit. )   Emesis   This is a new problem. The current episode started in the past 7 days. The problem occurs 2 to 4 times per day. The problem has been unchanged. The emesis has an appearance of bile (and 1episode of fecal matter). There has been no fever. Associated symptoms include dizziness. Pertinent negatives include no abdominal pain, chest pain, chills, diarrhea, fever, headaches, myalgias, sweats, URI or weight loss. Risk factors include suspect food intake (he thinks it is due to poorly preserved yogurt). He has tried nothing for the symptoms.  has had normal BM yesterday and today. Able to tolerate water intake., but no food.  Outpatient Medications Prior to Visit  Medication Sig Dispense Refill  . aspirin 81 MG tablet Take 81 mg by mouth daily.    . betamethasone dipropionate (DIPROLENE) 0.05 % cream Apply topically 2 (two) times daily. 90 g 0  . buPROPion (WELLBUTRIN XL) 300 MG 24 hr tablet TAKE 1 TABLET BY MOUTH EVERY MORNING 90 tablet 1  . fexofenadine (ALLEGRA) 180 MG tablet Take 180 mg by mouth daily.    . meclizine (ANTIVERT) 25 MG tablet Take 1 tablet (25 mg total) by mouth 2 (two) times daily as needed for dizziness. 30 tablet 0  . meloxicam (MOBIC) 15 MG tablet Take 1 tablet (15 mg total) by mouth daily as needed for pain. 30 tablet 0  . simvastatin (ZOCOR) 20 MG tablet Take 1 tablet (20 mg total) by mouth at bedtime. 90 tablet 3  . tamsulosin (FLOMAX) 0.4 MG CAPS capsule TAKE 1 CAPSULE BY MOUTH DAILY AFTER SUPPER 90 capsule 1   No facility-administered medications prior to visit.     ROS See HPI  Objective:  BP 130/66   Pulse 83   Temp 98.6 F (37 C)   Ht  (1.702 m)   Wt 142 lb (64.4 kg)   SpO2 98%   BMI 22.24 kg/m   BP Readings from Last 3 Encounters:    08/08/17 130/66  05/10/17 128/70  09/20/16 (!) 142/60    Wt Readings from Last 3 Encounters:  08/08/17 142 lb (64.4 kg)  05/10/17 148 lb (67.1 kg)  09/20/16 169 lb (76.7 kg)    Physical Exam  Constitutional: He is oriented to person, place, and time. No distress.  Cardiovascular: Normal rate.   Pulmonary/Chest: Effort normal.  Abdominal: Soft. Bowel sounds are normal. He exhibits no distension and no mass. There is no tenderness. There is no rebound and no guarding.  Neurological: He is alert and oriented to person, place, and time.  Skin: Skin is warm and dry.  Psychiatric: He has a normal mood and affect. His behavior is normal.  Vitals reviewed.   Lab Results  Component Value Date   WBC 4.7 05/11/2017   HGB 12.6 (L) 05/11/2017   HCT 38.1 (L) 05/11/2017   PLT 172.0 05/11/2017   GLUCOSE 96 05/11/2017   CHOL 137 05/11/2017   TRIG 88.0 05/11/2017   HDL 52.00 05/11/2017   LDLCALC 67 05/11/2017   ALT 17 05/11/2017   AST 19 05/11/2017   NA 137 05/11/2017   K 4.2 05/11/2017   CL 102 05/11/2017   CREATININE 0.85 05/11/2017   BUN 31 (H) 05/11/2017   CO2 28 05/11/2017  TSH 1.65 05/11/2017   PSA 0.12 07/05/2017    Ct Abdomen Pelvis W Wo Contrast  Result Date: 08/13/2013 CLINICAL DATA:  Possible pancreatic tail mass EXAM: CT ABDOMEN AND PELVIS WITHOUT AND WITH CONTRAST TECHNIQUE: Multidetector CT imaging of the abdomen and pelvis was performed without contrast material in one or both body regions, followed by contrast material(s) and further sections in one or both body regions. CONTRAST:  OMNIPAQUE IOHEXOL 350 MG/ML SOLN COMPARISON:  CT 03/29/2013 at [facility] BUN and creatinine were obtained on site at [facility] at [address] Results:  BUN 21 mg/dL,  Creatinine 0.8 mg/dL. FINDINGS: The lung bases are clear. Trace pericardial fluid. 1.5 cm cyst at the dome of the liver is again noted. Too small to characterize posterior segment  right hepatic lobe and other hepatic hypodensities most compatible with cysts are also stable. Mild image degradation due to patient motion. Gallbladder, adrenal glands, spleen, and right kidney are unremarkable. 2 mm nonobstructing left mid renal calculus image 33. The pancreas is partly fatty infiltrated. This is suspect there is asymmetric fatty infiltration most prominent at the proximal pancreas with relatively less fatty infiltration at the pancreatic tail. No surrounding stranding or fluid collection is identified and there is no pancreatic ductal dilatation. The pancreatic parenchyma enhances homogeneously. Although there is minimal subjective nodularity to the pancreatic tail, this is felt to most likely reflect relatively less fatty infiltration that elsewhere in the pancreas and no measurable mass lesion is identifiable separate from the pancreatic tissue. Bowel and bladder are unremarkable. Appendix is normal. Brachytherapy seeds are noted in the pelvis. Right-sided varicocele partly visualized. Small fat containing left inguinal hernia. No lymphadenopathy or ascites. There is mild impression upon the base of the bladder by the prostate. Minimal asymmetrical wall thickening is noted at the left aspect of the bladder trigone. 4 mm distal left ureteral calculus again identified image 138 series 4. There is mild proximal left ureteral fullness without overt hydronephrosis. Degenerative changes are noted in the spine. No lytic or sclerotic osseous lesion or acute osseous abnormality. IMPRESSION: Although there is nodularity at the tail of the pancreas, this is felt to most likely reflect asymmetrically decreased fatty infiltration in this area as compared to the proximal pancreas. No measurable mass is identified. No significant change from prior examinations dating back to the least recent similar comparison exam 12/03/2012. Persistent 4 mm distal left ureteral calculus with mild proximal hydroureter  reidentified, and mild thickening/possible edema at the left lateral bladder trigone region. Electronically Signed   By: Christiana Pellant M.D.   On: 08/13/2013 13:17    Assessment & Plan:   Jotham was seen today for emesis.  Diagnoses and all orders for this visit:  Intractable vomiting with nausea, unspecified vomiting type -     ondansetron (ZOFRAN) 4 MG tablet; Take 1 tablet (4 mg total) by mouth every 8 (eight) hours as needed for nausea or vomiting.  Vomiting of fecal matter with nausea   I am having Mr. Whichard start on ondansetron. I am also having him maintain his fexofenadine, aspirin, simvastatin, betamethasone dipropionate, buPROPion, tamsulosin, meclizine, and meloxicam.  Meds ordered this encounter  Medications  . ondansetron (ZOFRAN) 4 MG tablet    Sig: Take 1 tablet (4 mg total) by mouth every 8 (eight) hours as needed for nausea or vomiting.    Dispense:  20 tablet    Refill:  0    Order Specific Question:   Supervising Provider  Answer:   Binnie Rail [[id number]]    Follow-up: Return if symptoms worsen or fail to improve.  Wilfred Lacy, NP

## 2017-08-29 ENCOUNTER — Other Ambulatory Visit: Payer: Self-pay | Admitting: Internal Medicine

## 2017-09-29 ENCOUNTER — Other Ambulatory Visit: Payer: Self-pay | Admitting: Internal Medicine

## 2017-10-09 ENCOUNTER — Other Ambulatory Visit: Payer: Self-pay

## 2017-10-09 MED ORDER — TAMSULOSIN HCL 0.4 MG PO CAPS: ORAL_CAPSULE | ORAL | 1 refills | Status: DC

## 2017-12-25 ENCOUNTER — Ambulatory Visit: Payer: Medicare Other | Admitting: Internal Medicine

## 2017-12-25 ENCOUNTER — Encounter: Payer: Self-pay | Admitting: Internal Medicine

## 2017-12-25 ENCOUNTER — Other Ambulatory Visit (INDEPENDENT_AMBULATORY_CARE_PROVIDER_SITE_OTHER): Payer: Medicare Other

## 2017-12-25 VITALS — BP 130/78 | HR 65 | Temp 97.8°F | Ht 67.0 in | Wt 144.0 lb

## 2017-12-25 DIAGNOSIS — N401 Enlarged prostate with lower urinary tract symptoms: Secondary | ICD-10-CM | POA: Diagnosis not present

## 2017-12-25 DIAGNOSIS — Z8546 Personal history of malignant neoplasm of prostate: Secondary | ICD-10-CM

## 2017-12-25 DIAGNOSIS — R350 Frequency of micturition: Secondary | ICD-10-CM

## 2017-12-25 DIAGNOSIS — F325 Major depressive disorder, single episode, in full remission: Secondary | ICD-10-CM

## 2017-12-25 DIAGNOSIS — E785 Hyperlipidemia, unspecified: Secondary | ICD-10-CM | POA: Diagnosis not present

## 2017-12-25 DIAGNOSIS — Z0000 Encounter for general adult medical examination without abnormal findings: Secondary | ICD-10-CM | POA: Diagnosis not present

## 2017-12-25 LAB — CBC
HEMATOCRIT: 37.6 % — AB (ref 39.0–52.0)
Hemoglobin: 12.4 g/dL — ABNORMAL LOW (ref 13.0–17.0)
MCHC: 33 g/dL (ref 30.0–36.0)
MCV: 92.7 fl (ref 78.0–100.0)
Platelets: 168 10*3/uL (ref 150.0–400.0)
RBC: 4.06 Mil/uL — AB (ref 4.22–5.81)
RDW: 13.2 % (ref 11.5–15.5)
WBC: 4.3 10*3/uL (ref 4.0–10.5)

## 2017-12-25 LAB — LIPID PANEL
CHOL/HDL RATIO: 2
Cholesterol: 142 mg/dL (ref 0–200)
HDL: 61.9 mg/dL (ref >39.00)
LDL CALC: 65 mg/dL (ref 0–99)
NONHDL: 79.74
Triglycerides: 74 mg/dL (ref 0.0–149.0)
VLDL: 14.8 mg/dL (ref 0.0–40.0)

## 2017-12-25 LAB — COMPREHENSIVE METABOLIC PANEL
ALBUMIN: 4.1 g/dL (ref 3.5–5.2)
ALT: 17 U/L (ref 0–53)
AST: 18 U/L (ref 0–37)
Alkaline Phosphatase: 46 U/L (ref 39–117)
BUN: 27 mg/dL — AB (ref 6–23)
CHLORIDE: 103 meq/L (ref 96–112)
CO2: 32 mEq/L (ref 19–32)
CREATININE: 0.81 mg/dL (ref 0.40–1.50)
Calcium: 9.3 mg/dL (ref 8.4–10.5)
GFR: 96.13 mL/min (ref >60.00)
GLUCOSE: 89 mg/dL (ref 70–99)
POTASSIUM: 3.9 meq/L (ref 3.5–5.1)
SODIUM: 139 meq/L (ref 135–145)
Total Bilirubin: 0.6 mg/dL (ref 0.2–1.2)
Total Protein: 6.7 g/dL (ref 6.0–8.3)

## 2017-12-25 LAB — PSA: PSA: 0.11 ng/mL (ref 0.10–4.00)

## 2017-12-25 NOTE — Assessment & Plan Note (Signed)
Checking labs. Aged out of colonoscopy. Checking PSA due to history of cancer 2011. Declines tetanus. Flu and pneumonia up to date.

## 2017-12-25 NOTE — Assessment & Plan Note (Signed)
Checking lipid panel and adjust simvastatin as needed. Taking daily aspirin daily.

## 2017-12-25 NOTE — Assessment & Plan Note (Signed)
Checking PSA, adjust as needed. Taking flomax.

## 2017-12-25 NOTE — Assessment & Plan Note (Signed)
Controlled with wellbutrin. Symptoms are minimal now. Denies SI/HI, elects to continue given severity of symptoms prior to wellbutrin.

## 2017-12-25 NOTE — Progress Notes (Signed)
   Subjective:    Patient ID: Jesse Hampton, male    DOB: 05/16/1930, 84 y.o.   MRN: [id number]  HPI The patient is an 84 YO man coming in for physical.   PMH, [facility], social history reviewed and updated.   Review of Systems  Constitutional: Negative.   HENT: Negative.   Eyes: Negative.   Respiratory: Negative for cough, chest tightness and shortness of breath.   Cardiovascular: Negative for chest pain, palpitations and leg swelling.  Gastrointestinal: Negative for abdominal distention, abdominal pain, constipation, diarrhea, nausea and vomiting.  Musculoskeletal: Negative.   Skin: Negative.   Neurological: Negative.   Psychiatric/Behavioral: Negative.       Objective:   Physical Exam  Constitutional: He is oriented to person, place, and time. He appears well-developed and well-nourished.  HENT:  Head: Normocephalic and atraumatic.  Eyes: EOM are normal.  Neck: Normal range of motion.  Cardiovascular: Normal rate and regular rhythm.  Pulmonary/Chest: Effort normal and breath sounds normal. No respiratory distress. He has no wheezes. He has no rales.  Abdominal: Soft. Bowel sounds are normal. He exhibits no distension. There is no tenderness. There is no rebound.  Musculoskeletal: He exhibits no edema.  Neurological: He is alert and oriented to person, place, and time. Coordination normal.  Skin: Skin is warm and dry.  Psychiatric: He has a normal mood and affect.   Vitals:   12/25/17 1324  BP: 130/78  Pulse: 65  Temp: 97.8 F (36.6 C)  TempSrc: Oral  SpO2: 97%  Weight: 144 lb (65.3 kg)  Height: 5\' 7"  (1.702 m)      Assessment & Plan:

## 2017-12-25 NOTE — Patient Instructions (Signed)
We will check the labs today.   Health Maintenance, Male A healthy lifestyle and preventive care is important for your health and wellness. Ask your health care provider about what schedule of regular examinations is right for you. What should I know about weight and diet? Eat a Healthy Diet  Eat plenty of vegetables, fruits, whole grains, low-fat dairy products, and lean protein.  Do not eat a lot of foods high in solid fats, added sugars, or salt.  Maintain a Healthy Weight Regular exercise can help you achieve or maintain a healthy weight. You should:  Do at least 150 minutes of exercise each week. The exercise should increase your heart rate and make you sweat (moderate-intensity exercise).  Do strength-training exercises at least twice a week.  Watch Your Levels of Cholesterol and Blood Lipids  Have your blood tested for lipids and cholesterol every 5 years starting at 82 years of age. If you are at high risk for heart disease, you should start having your blood tested when you are 82 years old. You may need to have your cholesterol levels checked more often if: ? Your lipid or cholesterol levels are high. ? You are older than 82 years of age. ? You are at high risk for heart disease.  What should I know about cancer screening? Many types of cancers can be detected early and may often be prevented. Lung Cancer  You should be screened every year for lung cancer if: ? You are a current smoker who has smoked for at least 30 years. ? You are a former smoker who has quit within the past 15 years.  Talk to your health care provider about your screening options, when you should start screening, and how often you should be screened.  Colorectal Cancer  Routine colorectal cancer screening usually begins at 82 years of age and should be repeated every 5-10 years until you are 82 years old. You may need to be screened more often if early forms of precancerous polyps or small growths  are found. Your health care provider may recommend screening at an earlier age if you have risk factors for colon cancer.  Your health care provider may recommend using home test kits to check for hidden blood in the stool.  A small camera at the end of a tube can be used to examine your colon (sigmoidoscopy or colonoscopy). This checks for the earliest forms of colorectal cancer.  Prostate and Testicular Cancer  Depending on your age and overall health, your health care provider may do certain tests to screen for prostate and testicular cancer.  Talk to your health care provider about any symptoms or concerns you have about testicular or prostate cancer.  Skin Cancer  Check your skin from head to toe regularly.  Tell your health care provider about any new moles or changes in moles, especially if: ? There is a change in a mole's size, shape, or color. ? You have a mole that is larger than a pencil eraser.  Always use sunscreen. Apply sunscreen liberally and repeat throughout the day.  Protect yourself by wearing long sleeves, pants, a wide-brimmed hat, and sunglasses when outside.  What should I know about heart disease, diabetes, and high blood pressure?  If you are 18-39 years of age, have your blood pressure checked every 3-5 years. If you are 40 years of age or older, have your blood pressure checked every year. You should have your blood pressure measured twice-once when you are   at a hospital or clinic, and once when you are not at a hospital or clinic. Record the average of the two measurements. To check your blood pressure when you are not at a hospital or clinic, you can use: ? An automated blood pressure machine at a pharmacy. ? A home blood pressure monitor.  Talk to your health care provider about your target blood pressure.  If you are between 45-79 years old, ask your health care provider if you should take aspirin to prevent heart disease.  Have regular diabetes  screenings by checking your fasting blood sugar level. ? If you are at a normal weight and have a low risk for diabetes, have this test once every three years after the age of 45. ? If you are overweight and have a high risk for diabetes, consider being tested at a younger age or more often.  A one-time screening for abdominal aortic aneurysm (AAA) by ultrasound is recommended for men aged 65-75 years who are current or former smokers. What should I know about preventing infection? Hepatitis B If you have a higher risk for hepatitis B, you should be screened for this virus. Talk with your health care provider to find out if you are at risk for hepatitis B infection. Hepatitis C Blood testing is recommended for:  Everyone born from 1945 through 1965.  Anyone with known risk factors for hepatitis C.  Sexually Transmitted Diseases (STDs)  You should be screened each year for STDs including gonorrhea and chlamydia if: ? You are sexually active and are younger than 82 years of age. ? You are older than 82 years of age and your health care provider tells you that you are at risk for this type of infection. ? Your sexual activity has changed since you were last screened and you are at an increased risk for chlamydia or gonorrhea. Ask your health care provider if you are at risk.  Talk with your health care provider about whether you are at high risk of being infected with HIV. Your health care provider may recommend a prescription medicine to help prevent HIV infection.  What else can I do?  Schedule regular health, dental, and eye exams.  Stay current with your vaccines (immunizations).  Do not use any tobacco products, such as cigarettes, chewing tobacco, and e-cigarettes. If you need help quitting, ask your health care provider.  Limit alcohol intake to no more than 2 drinks per day. One drink equals 12 ounces of beer, 5 ounces of wine, or 1 ounces of hard liquor.  Do not use street  drugs.  Do not share needles.  Ask your health care provider for help if you need support or information about quitting drugs.  Tell your health care provider if you often feel depressed.  Tell your health care provider if you have ever been abused or do not feel safe at home. This information is not intended to replace advice given to you by your health care provider. Make sure you discuss any questions you have with your health care provider. Document Released: 04/07/2008 Document Revised: 06/08/2016 Document Reviewed: 07/14/2015 Elsevier Interactive Patient Education  2018 [employer]  

## 2017-12-27 ENCOUNTER — Other Ambulatory Visit: Payer: Self-pay | Admitting: Internal Medicine

## 2018-01-25 ENCOUNTER — Other Ambulatory Visit: Payer: Self-pay | Admitting: Internal Medicine

## 2018-02-20 ENCOUNTER — Ambulatory Visit: Payer: Self-pay | Admitting: *Deleted

## 2018-02-20 NOTE — Telephone Encounter (Signed)
Patient said that he pulled a tick off of his left arm yesterday. He said that now he has a lump on his arm & he wants to know what he further needs to do. Patient is seeing a slight bump- smaller than a dime-normal skin color- no other symptoms associated. Precautions given and patient to call back if redness, rash, headache or infection appear. Patient states understanding.  Reason for Disposition . Tick bite with no complications  Answer Assessment - Initial Assessment Questions 1. TYPE of TICK: "Is it a wood tick or a deer tick?" If unsure, ask: "What size was the tick?" "Did it look more like a watermelon seed or a poppy seed?"      Unsure type, small 2. LOCATION: "Where is the tick bite located?"      Left arm at inside of elbow crease 3. ONSET: "How long do you think the tick was attached before you removed it?" (Hours or days)      Possibly 8 hours 4. TETANUS: "When was the last tetanus booster?"      Up to date 5. PREGNANCY: "Is there any chance you are pregnant?" "When was your last menstrual period?"     n/a  Protocols used: TICK BITE-A-AH

## 2018-02-21 ENCOUNTER — Other Ambulatory Visit: Payer: Self-pay | Admitting: Internal Medicine

## 2018-03-24 ENCOUNTER — Other Ambulatory Visit: Payer: Self-pay | Admitting: Internal Medicine

## 2018-03-26 NOTE — Telephone Encounter (Signed)
Please advise regarding Wellbutrin refill request.  Thanks

## 2018-04-04 ENCOUNTER — Other Ambulatory Visit: Payer: Self-pay | Admitting: Internal Medicine

## 2018-05-10 ENCOUNTER — Telehealth: Payer: Self-pay | Admitting: Internal Medicine

## 2018-05-10 NOTE — Telephone Encounter (Signed)
LVM for patient to call back all his Rx's look up to date on refills and his labs are up to date as well. Patient had labs done in march. I do see however the TSH was done a year ago is this the only lab he is requesting?

## 2018-05-10 NOTE — Telephone Encounter (Signed)
Pt is requesting briana to return his call. Pt would like tsh and additional blood work

## 2018-05-10 NOTE — Telephone Encounter (Unsigned)
Copied from CRM [id number]. Topic: Quick Communication - See Telephone Encounter >> May 10, 2018 11:17 AM Raquel SarnaHayes, Teresa G wrote: Pt is wanting labs - one of the labs is his thyroid. Pt is also wanting to check if his Rx's are all up to date with refills.

## 2018-05-10 NOTE — Telephone Encounter (Signed)
Called patient he states that he is alright knowing he had routine labs in march and they were all normal states he will call back when he decides he wants other labs done.

## 2018-06-21 ENCOUNTER — Other Ambulatory Visit: Payer: Self-pay | Admitting: Internal Medicine

## 2018-09-11 NOTE — Progress Notes (Addendum)
Subjective:   Jesse Hampton is a 84 y.o. male who presents for Medicare Annual/Subsequent preventive examination.  Review of Systems:  No ROS.  Medicare Wellness Visit. Additional risk factors are reflected in the social history.  Cardiac Risk Factors include: advanced age (>17men, >74 women);dyslipidemia;male gender Sleep patterns: gets up 1-2 times nightly to void and sleeps 6-7 hours nightly.    Home Safety/Smoke Alarms: Feels safe in home. Smoke alarms in place.  Living environment; residence and Firearm Safety: 1-story house/ trailer, no firearms Lives with wife, no needs for DME, good support system Seat Belt Safety/Bike Helmet: Wears seat belt.   PSA-  Lab Results  Component Value Date   PSA 0.11 12/25/2017   PSA 0.12 07/05/2017   PSA 0.11 05/11/2017       Objective:    Vitals: BP 126/62   Pulse 62   Resp 17   Ht 5\' 7"  (1.702 m)   Wt 143 lb (64.9 kg)   SpO2 98%   BMI 22.40 kg/m   Body mass index is 22.4 kg/m.  Advanced Directives 09/12/2018 01/03/2013  Does Patient Have a Medical Advance Directive? Yes Patient has advance directive, copy not in chart  Type of Advance Directive Healthcare Power of [place];Living will Healthcare Power of [place];Living will  Copy of Healthcare Power of Attorney in Chart? No - copy requested Copy requested from family  Pre-existing out of facility DNR order (yellow form or pink MOST form) - No    Tobacco Social History   Tobacco Use  Smoking Status Never Smoker  Smokeless Tobacco Never Used     Counseling given: Not Answered  Past Medical History:  Diagnosis Date  . Anxiety   . Arthritis    hands,  . Chronic kidney disease    kidney stones  . Depression    Past Surgical History:  Procedure Laterality Date  . EYE SURGERY  2006   cartaract bil.   Family History  Problem Relation Age of Onset  . Stroke Mother   . Alzheimer's disease Mother   . Cancer Father        prostate  . Stroke Father   .  Alzheimer's disease Brother    Social History   Socioeconomic History  . Marital status: Married    Spouse name: Not on file  . Number of children: 3  . Years of education: Not on file  . Highest education level: Not on file  Occupational History  . Occupation: retired philosophy professor [employer]  Social Needs  . Financial resource strain: Not hard at all  . Food insecurity:    Worry: Never true    Inability: Never true  . Transportation needs:    Medical: No    Non-medical: No  Tobacco Use  . Smoking status: Never Smoker  . Smokeless tobacco: Never Used  Substance and Sexual Activity  . Alcohol use: No  . Drug use: No  . Sexual activity: Not Currently  Lifestyle  . Physical activity:    Days per week: 7 days    Minutes per session: 50 min  . Stress: Only a little  Relationships  . Social connections:    Talks on phone: More than three times a week    Gets together: More than three times a week    Attends religious service: More than 4 times per year    Active member of club or organization: Yes    Attends meetings of clubs or organizations: More than 4 times  per year    Relationship status: Married  Other Topics Concern  . Not on file  Social History Narrative  . Not on file    Outpatient Encounter Medications as of 09/12/2018  Medication Sig  . betamethasone dipropionate (DIPROLENE) 0.05 % cream APPLY TOPICALLY TWICE DAILY  . buPROPion (WELLBUTRIN XL) 300 MG 24 hr tablet TAKE 1 TABLET BY MOUTH EVERY MORNING. NEED TO SCHEDULE OFFICE VISIT.  Marland Kitchen. fexofenadine (ALLEGRA) 180 MG tablet Take 180 mg by mouth as needed.   . meclizine (ANTIVERT) 25 MG tablet Take 1 tablet (25 mg total) by mouth 2 (two) times daily as needed for dizziness.  . meloxicam (MOBIC) 15 MG tablet Take 1 tablet (15 mg total) by mouth daily as needed for pain.  Marland Kitchen. ondansetron (ZOFRAN) 4 MG tablet Take 1 tablet (4 mg total) by mouth every 8 (eight) hours as needed for nausea or vomiting.  . simvastatin  (ZOCOR) 20 MG tablet TAKE 1 TABLET BY MOUTH AT BEDTIME  . tamsulosin (FLOMAX) 0.4 MG CAPS capsule TAKE ONE CAPSULE BY MOUTH DAILY AFTER SUPPER  . [DISCONTINUED] aspirin 81 MG tablet Take 81 mg by mouth daily.   No facility-administered encounter medications on file as of 09/12/2018.     Activities of Daily Living In your present state of health, do you have any difficulty performing the following activities: 09/12/2018  Hearing? N  Vision? N  Difficulty concentrating or making decisions? N  Walking or climbing stairs? N  Dressing or bathing? N  Doing errands, shopping? N  Preparing Food and eating ? N  Using the Toilet? N  In the past six months, have you accidently leaked urine? N  Do you have problems with loss of bowel control? N  Managing your Medications? N  Managing your Finances? N  Housekeeping or managing your Housekeeping? N  Some recent data might be hidden    Patient Care Team: Myrlene Brokerrawford, Elizabeth A, MD as PCP - General (Internal Medicine)   Assessment:   This is a routine wellness examination for Jesse Hampton. Physical assessment deferred to PCP.   Exercise Activities and Dietary recommendations Current Exercise Habits: Home exercise routine, Type of exercise: walking, Time (Minutes): 55, Frequency (Times/Week): 6, Weekly Exercise (Minutes/Week): 330, Intensity: Mild, Exercise limited by: None identified Diet (meal preparation, eat out, water intake, caffeinated beverages, dairy products, fruits and vegetables): in general, a "healthy" diet  , well balanced eats a variety of fruits and vegetables daily, limits salt, fat/cholesterol, sugar,carbohydrates,caffeine, drinks 6-8 glasses of water daily.  Goals    . Patient Stated     Continue to maintain my weight, walk daily, enjoy spending time with my family.       Fall Risk Fall Risk  09/12/2018 08/08/2017 04/14/2016  Falls in the past year? 0 No No    Depression Screen PHQ 2/9 Scores 09/12/2018 08/08/2017 04/14/2016   PHQ - 2 Score 2 2 0  PHQ- 9 Score 3 3 -    Cognitive Function MMSE - Mini Mental State Exam 09/12/2018  Not completed: Refused       Ad8 score reviewed for issues:  Issues making decisions: no  Less interest in hobbies / activities: no  Repeats questions, stories (family complaining): no  Trouble using ordinary gadgets (microwave, computer, phone):no  Forgets the month or year: no  Mismanaging finances: no  Remembering appts: no  Daily problems with thinking and/or memory: no Ad8 score is= 0  Immunization History  Administered Date(s) Administered  . Influenza, High Dose  Seasonal PF 07/29/2017  . Influenza,inj,Quad PF,6+ Mos 08/07/2014  . Influenza-Unspecified 08/08/2016, 07/25/2018  . Pneumococcal Conjugate-13 04/02/2015  . Pneumococcal Polysaccharide-23 09/20/2016   Screening Tests Health Maintenance  Topic Date Due  . TETANUS/TDAP  05/27/1949  . INFLUENZA VACCINE  Completed  . PNA vac Low Risk Adult  Completed      Plan:     Continue doing brain stimulating activities (puzzles, reading, adult coloring books, staying active) to keep memory sharp.   Continue to eat heart healthy diet (full of fruits, vegetables, whole grains, lean protein, water--limit salt, fat, and sugar intake) and increase physical activity as tolerated.  I have personally reviewed and noted the following in the patient's chart:   . Medical and social history . Use of alcohol, tobacco or illicit drugs  . Current medications and supplements . Functional ability and status . Nutritional status . Physical activity . Advanced directives . List of other physicians . Vitals . Screenings to include cognitive, depression, and falls . Referrals and appointments  In addition, I have reviewed and discussed with patient certain preventive protocols, quality metrics, and best practice recommendations. A written personalized care plan for preventive services as well as general preventive  health recommendations were provided to patient.     Wanda Plump, RN  09/12/2018  Medical screening examination/treatment/procedure(s) were performed by non-physician practitioner and as supervising physician I was immediately available for consultation/collaboration. I agree with above. Sanda Linger, MD

## 2018-09-12 ENCOUNTER — Ambulatory Visit (INDEPENDENT_AMBULATORY_CARE_PROVIDER_SITE_OTHER): Payer: Medicare Other | Admitting: *Deleted

## 2018-09-12 VITALS — BP 126/62 | HR 62 | Resp 17 | Ht 67.0 in | Wt 143.0 lb

## 2018-09-12 DIAGNOSIS — Z0000 Encounter for general adult medical examination without abnormal findings: Secondary | ICD-10-CM | POA: Diagnosis not present

## 2018-09-12 NOTE — Patient Instructions (Addendum)
Continue doing brain stimulating activities (puzzles, reading, adult coloring books, staying active) to keep memory sharp.   Continue to eat heart healthy diet (full of fruits, vegetables, whole grains, lean protein, water--limit salt, fat, and sugar intake) and increase physical activity as tolerated.   Mr. Jesse Hampton , Thank you for taking time to come for your Medicare Wellness Visit. I appreciate your ongoing commitment to your health goals. Please review the following plan we discussed and let me know if I can assist you in the future.   These are the goals we discussed: Goals    . Patient Stated     Continue to maintain my weight, walk daily, enjoy spending time with my family.       This is a list of the screening recommended for you and due dates:  Health Maintenance  Topic Date Due  . Tetanus Vaccine  05/27/1949  . Flu Shot  Completed  . Pneumonia vaccines  Completed      Health Maintenance, Male A healthy lifestyle and preventive care is important for your health and wellness. Ask your health care provider about what schedule of regular examinations is right for you. What should I know about weight and diet? Eat a Healthy Diet  Eat plenty of vegetables, fruits, whole grains, low-fat dairy products, and lean protein.  Do not eat a lot of foods high in solid fats, added sugars, or salt.  Maintain a Healthy Weight Regular exercise can help you achieve or maintain a healthy weight. You should:  Do at least 150 minutes of exercise each week. The exercise should increase your heart rate and make you sweat (moderate-intensity exercise).  Do strength-training exercises at least twice a week.  Watch Your Levels of Cholesterol and Blood Lipids  Have your blood tested for lipids and cholesterol every 5 years starting at 82 years of age. If you are at high risk for heart disease, you should start having your blood tested when you are 82 years old. You may need to have your  cholesterol levels checked more often if: ? Your lipid or cholesterol levels are high. ? You are older than 82 years of age. ? You are at high risk for heart disease.  What should I know about cancer screening? Many types of cancers can be detected early and may often be prevented. Lung Cancer  You should be screened every year for lung cancer if: ? You are a current smoker who has smoked for at least 30 years. ? You are a former smoker who has quit within the past 15 years.  Talk to your health care provider about your screening options, when you should start screening, and how often you should be screened.  Colorectal Cancer  Routine colorectal cancer screening usually begins at 82 years of age and should be repeated every 5-10 years until you are 82 years old. You may need to be screened more often if early forms of precancerous polyps or small growths are found. Your health care provider may recommend screening at an earlier age if you have risk factors for colon cancer.  Your health care provider may recommend using home test kits to check for hidden blood in the stool.  A small camera at the end of a tube can be used to examine your colon (sigmoidoscopy or colonoscopy). This checks for the earliest forms of colorectal cancer.  Prostate and Testicular Cancer  Depending on your age and overall health, your health care provider may do certain  tests to screen for prostate and testicular cancer.  Talk to your health care provider about any symptoms or concerns you have about testicular or prostate cancer.  Skin Cancer  Check your skin from head to toe regularly.  Tell your health care provider about any new moles or changes in moles, especially if: ? There is a change in a mole's size, shape, or color. ? You have a mole that is larger than a pencil eraser.  Always use sunscreen. Apply sunscreen liberally and repeat throughout the day.  Protect yourself by wearing long sleeves,  pants, a wide-brimmed hat, and sunglasses when outside.  What should I know about heart disease, diabetes, and high blood pressure?  If you are 57-79 years of age, have your blood pressure checked every 3-5 years. If you are 64 years of age or older, have your blood pressure checked every year. You should have your blood pressure measured twice-once when you are at a hospital or clinic, and once when you are not at a hospital or clinic. Record the average of the two measurements. To check your blood pressure when you are not at a hospital or clinic, you can use: ? An automated blood pressure machine at a pharmacy. ? A home blood pressure monitor.  Talk to your health care provider about your target blood pressure.  If you are between 52-50 years old, ask your health care provider if you should take aspirin to prevent heart disease.  Have regular diabetes screenings by checking your fasting blood sugar level. ? If you are at a normal weight and have a low risk for diabetes, have this test once every three years after the age of 31. ? If you are overweight and have a high risk for diabetes, consider being tested at a younger age or more often.  A one-time screening for abdominal aortic aneurysm (AAA) by ultrasound is recommended for men aged 44-75 years who are current or former smokers. What should I know about preventing infection? Hepatitis B If you have a higher risk for hepatitis B, you should be screened for this virus. Talk with your health care provider to find out if you are at risk for hepatitis B infection. Hepatitis C Blood testing is recommended for:  Everyone born from [birth year] through 1963.  Anyone with known risk factors for hepatitis C.  Sexually Transmitted Diseases (STDs)  You should be screened each year for STDs including gonorrhea and chlamydia if: ? You are sexually active and are younger than 82 years of age. ? You are older than 82 years of age and your health care  provider tells you that you are at risk for this type of infection. ? Your sexual activity has changed since you were last screened and you are at an increased risk for chlamydia or gonorrhea. Ask your health care provider if you are at risk.  Talk with your health care provider about whether you are at high risk of being infected with HIV. Your health care provider may recommend a prescription medicine to help prevent HIV infection.  What else can I do?  Schedule regular health, dental, and eye exams.  Stay current with your vaccines (immunizations).  Do not use any tobacco products, such as cigarettes, chewing tobacco, and e-cigarettes. If you need help quitting, ask your health care provider.  Limit alcohol intake to no more than 2 drinks per day. One drink equals 12 ounces of beer, 5 ounces of wine, or 1 ounces of hard  liquor.  Do not use street drugs.  Do not share needles.  Ask your health care provider for help if you need support or information about quitting drugs.  Tell your health care provider if you often feel depressed.  Tell your health care provider if you have ever been abused or do not feel safe at home. This information is not intended to replace advice given to you by your health care provider. Make sure you discuss any questions you have with your health care provider. Document Released: 04/07/2008 Document Revised: 06/08/2016 Document Reviewed: 07/14/2015 Elsevier Interactive Patient Education  [employer].

## 2018-09-17 ENCOUNTER — Ambulatory Visit (INDEPENDENT_AMBULATORY_CARE_PROVIDER_SITE_OTHER): Payer: Medicare Other | Admitting: Internal Medicine

## 2018-09-17 ENCOUNTER — Encounter: Payer: Self-pay | Admitting: Internal Medicine

## 2018-09-17 ENCOUNTER — Other Ambulatory Visit (INDEPENDENT_AMBULATORY_CARE_PROVIDER_SITE_OTHER): Payer: Medicare Other

## 2018-09-17 VITALS — BP 122/70 | HR 78 | Temp 98.1°F | Ht 67.0 in | Wt 142.0 lb

## 2018-09-17 DIAGNOSIS — M545 Low back pain: Secondary | ICD-10-CM

## 2018-09-17 DIAGNOSIS — E041 Nontoxic single thyroid nodule: Secondary | ICD-10-CM | POA: Diagnosis not present

## 2018-09-17 DIAGNOSIS — L299 Pruritus, unspecified: Secondary | ICD-10-CM | POA: Diagnosis not present

## 2018-09-17 DIAGNOSIS — M5416 Radiculopathy, lumbar region: Secondary | ICD-10-CM

## 2018-09-17 DIAGNOSIS — G8929 Other chronic pain: Secondary | ICD-10-CM

## 2018-09-17 LAB — COMPREHENSIVE METABOLIC PANEL
ALT: 16 U/L (ref 0–53)
AST: 20 U/L (ref 0–37)
Albumin: 4.3 g/dL (ref 3.5–5.2)
Alkaline Phosphatase: 45 U/L (ref 39–117)
BILIRUBIN TOTAL: 0.4 mg/dL (ref 0.2–1.2)
BUN: 24 mg/dL — ABNORMAL HIGH (ref 6–23)
CHLORIDE: 103 meq/L (ref 96–112)
CO2: 26 meq/L (ref 19–32)
CREATININE: 0.9 mg/dL (ref 0.40–1.50)
Calcium: 9.1 mg/dL (ref 8.4–10.5)
GFR: 84.98 mL/min (ref >60.00)
GLUCOSE: 104 mg/dL — AB (ref 70–99)
Potassium: 4.1 mEq/L (ref 3.5–5.1)
Sodium: 137 mEq/L (ref 135–145)
Total Protein: 6.9 g/dL (ref 6.0–8.3)

## 2018-09-17 LAB — CBC
HCT: 37.5 % — ABNORMAL LOW (ref 39.0–52.0)
Hemoglobin: 12.5 g/dL — ABNORMAL LOW (ref 13.0–17.0)
MCHC: 33.3 g/dL (ref 30.0–36.0)
MCV: 92.9 fl (ref 78.0–100.0)
Platelets: 170 10*3/uL (ref 150.0–400.0)
RBC: 4.04 Mil/uL — ABNORMAL LOW (ref 4.22–5.81)
RDW: 13.3 % (ref 11.5–15.5)
WBC: 3.5 10*3/uL — ABNORMAL LOW (ref 4.0–10.5)

## 2018-09-17 LAB — TSH: TSH: 1.52 u[IU]/mL (ref 0.35–4.50)

## 2018-09-17 MED ORDER — BUPROPION HCL ER (XL) 300 MG PO TB24: ORAL_TABLET | ORAL | 3 refills | Status: DC

## 2018-09-17 MED ORDER — SIMVASTATIN 20 MG PO TABS: 20.0000 mg | ORAL_TABLET | Freq: Every day | ORAL | 3 refills | Status: DC

## 2018-09-17 MED ORDER — MELOXICAM 15 MG PO TABS: 15.0000 mg | ORAL_TABLET | Freq: Every day | ORAL | 6 refills | Status: AC | PRN

## 2018-09-17 MED ORDER — BETAMETHASONE DIPROPIONATE 0.05 % EX CREA: TOPICAL_CREAM | Freq: Two times a day (BID) | CUTANEOUS | 6 refills | Status: AC

## 2018-09-17 MED ORDER — TAMSULOSIN HCL 0.4 MG PO CAPS: ORAL_CAPSULE | ORAL | 3 refills | Status: AC

## 2018-09-17 NOTE — Assessment & Plan Note (Signed)
Rx for betamethasone to help with the itching. No skin lesions new recently.

## 2018-09-17 NOTE — Patient Instructions (Signed)
We will check the blood today and call you back about the results.

## 2018-09-17 NOTE — Assessment & Plan Note (Signed)
Refill meloxicam so he can take as needed. He is overall improving with regular exercise.

## 2018-09-17 NOTE — Progress Notes (Signed)
   Subjective:    Patient ID: Jesse Hampton, male    DOB: 09/03/1930, 84 y.o.   MRN: [id number]  HPI The patient is an 84 YO man coming in for follow up of his thyroid nodule (noted incidentally 10 years ago, biopsy inconclusive, denies heat or cold intolerance, denies weight changes, denies sensation of globus or swallowing problems) and back pain (rarely uses meloxicam, walking 1-2 miles per day and this has helped immensely, used to drive to see his brother with dementia in [place] once a month and since he has passed he is no longer doing this), and chronic pruritus (uses betamethasone on his skin routinely, this is the worst time of year for this, uses lotion as well which helps some, denies skin breakdown or new lesion, sees derm every few years).   Review of Systems  Constitutional: Negative.   HENT: Negative.   Eyes: Negative.   Respiratory: Negative for cough, chest tightness and shortness of breath.   Cardiovascular: Negative for chest pain, palpitations and leg swelling.  Gastrointestinal: Negative for abdominal distention, abdominal pain, constipation, diarrhea, nausea and vomiting.  Endocrine: Negative.   Musculoskeletal: Positive for arthralgias. Negative for back pain, gait problem, myalgias and neck pain.  Skin: Negative.        itching  Neurological: Negative.   Psychiatric/Behavioral: Negative.       Objective:   Physical Exam  Constitutional: He is oriented to person, place, and time. He appears well-developed and well-nourished.  HENT:  Head: Normocephalic and atraumatic.  Eyes: EOM are normal.  Neck: Normal range of motion.  Cardiovascular: Normal rate and regular rhythm.  Pulmonary/Chest: Effort normal and breath sounds normal. No respiratory distress. He has no wheezes. He has no rales.  Abdominal: Soft. Bowel sounds are normal. He exhibits no distension. There is no tenderness. There is no rebound.  Musculoskeletal: He exhibits no edema.  Neurological: He is  alert and oriented to person, place, and time. Coordination normal.  Skin: Skin is warm and dry.  Psychiatric: He has a normal mood and affect.   Vitals:   09/17/18 0926  BP: 122/70  Pulse: 78  Temp: 98.1 F (36.7 C)  TempSrc: Oral  SpO2: 96%  Weight: 142 lb (64.4 kg)  Height: 5\' 7"  (1.702 m)      Assessment & Plan:

## 2018-09-17 NOTE — Assessment & Plan Note (Signed)
Checking TSH and adjust as needed. Exam without palpable thyroid tissue, no sensation of globus.

## 2018-09-28 ENCOUNTER — Other Ambulatory Visit: Payer: Self-pay | Admitting: Internal Medicine

## 2019-09-02 ENCOUNTER — Other Ambulatory Visit: Payer: Self-pay

## 2019-09-02 MED ORDER — BUPROPION HCL ER (XL) 300 MG PO TB24: ORAL_TABLET | ORAL | 0 refills | Status: AC

## 2019-09-02 MED ORDER — SIMVASTATIN 20 MG PO TABS: 20.0000 mg | ORAL_TABLET | Freq: Every day | ORAL | 0 refills | Status: AC

## 2019-11-27 ENCOUNTER — Other Ambulatory Visit: Payer: Self-pay | Admitting: Internal Medicine

## 2020-05-07 DIAGNOSIS — H6123 Impacted cerumen, bilateral: Secondary | ICD-10-CM | POA: Diagnosis not present

## 2020-05-07 DIAGNOSIS — H903 Sensorineural hearing loss, bilateral: Secondary | ICD-10-CM | POA: Diagnosis not present

## 2020-05-07 DIAGNOSIS — Z974 Presence of external hearing-aid: Secondary | ICD-10-CM | POA: Diagnosis not present

## 2020-06-11 ENCOUNTER — Ambulatory Visit: Payer: Medicare Other | Admitting: Physical Therapy

## 2020-06-17 ENCOUNTER — Ambulatory Visit: Payer: Medicare Other | Admitting: Physical Therapy

## 2020-06-24 ENCOUNTER — Encounter: Payer: Medicare Other | Admitting: Physical Therapy

## 2020-07-02 ENCOUNTER — Encounter: Payer: Medicare Other | Admitting: Physical Therapy

## 2020-07-27 DIAGNOSIS — E041 Nontoxic single thyroid nodule: Secondary | ICD-10-CM | POA: Diagnosis not present

## 2020-07-27 DIAGNOSIS — Z0000 Encounter for general adult medical examination without abnormal findings: Secondary | ICD-10-CM | POA: Diagnosis not present

## 2020-07-27 DIAGNOSIS — F325 Major depressive disorder, single episode, in full remission: Secondary | ICD-10-CM | POA: Diagnosis not present

## 2020-07-27 DIAGNOSIS — E785 Hyperlipidemia, unspecified: Secondary | ICD-10-CM | POA: Diagnosis not present

## 2020-07-27 DIAGNOSIS — Z8546 Personal history of malignant neoplasm of prostate: Secondary | ICD-10-CM | POA: Diagnosis not present

## 2020-09-04 DIAGNOSIS — H903 Sensorineural hearing loss, bilateral: Secondary | ICD-10-CM | POA: Diagnosis not present

## 2021-02-19 DIAGNOSIS — Z8546 Personal history of malignant neoplasm of prostate: Secondary | ICD-10-CM | POA: Diagnosis not present

## 2021-02-26 DIAGNOSIS — R3914 Feeling of incomplete bladder emptying: Secondary | ICD-10-CM | POA: Diagnosis not present

## 2021-02-26 DIAGNOSIS — N401 Enlarged prostate with lower urinary tract symptoms: Secondary | ICD-10-CM | POA: Diagnosis not present

## 2021-02-26 DIAGNOSIS — Z8546 Personal history of malignant neoplasm of prostate: Secondary | ICD-10-CM | POA: Diagnosis not present

## 2021-03-04 DIAGNOSIS — L309 Dermatitis, unspecified: Secondary | ICD-10-CM | POA: Diagnosis not present

## 2021-03-04 DIAGNOSIS — M79641 Pain in right hand: Secondary | ICD-10-CM | POA: Diagnosis not present

## 2021-08-06 DIAGNOSIS — E785 Hyperlipidemia, unspecified: Secondary | ICD-10-CM | POA: Diagnosis not present

## 2021-08-06 DIAGNOSIS — F325 Major depressive disorder, single episode, in full remission: Secondary | ICD-10-CM | POA: Diagnosis not present

## 2021-08-06 DIAGNOSIS — Z0000 Encounter for general adult medical examination without abnormal findings: Secondary | ICD-10-CM | POA: Diagnosis not present

## 2021-08-06 DIAGNOSIS — E041 Nontoxic single thyroid nodule: Secondary | ICD-10-CM | POA: Diagnosis not present

## 2021-08-06 DIAGNOSIS — Z8546 Personal history of malignant neoplasm of prostate: Secondary | ICD-10-CM | POA: Diagnosis not present

## 2021-08-06 DIAGNOSIS — R2689 Other abnormalities of gait and mobility: Secondary | ICD-10-CM | POA: Diagnosis not present

## 2021-08-06 DIAGNOSIS — D649 Anemia, unspecified: Secondary | ICD-10-CM | POA: Diagnosis not present

## 2021-09-03 ENCOUNTER — Ambulatory Visit: Payer: Medicare PPO | Admitting: Physical Therapy

## 2021-09-07 ENCOUNTER — Ambulatory Visit: Payer: Self-pay | Admitting: Physical Therapy

## 2021-09-08 ENCOUNTER — Other Ambulatory Visit: Payer: Self-pay

## 2021-09-08 ENCOUNTER — Encounter: Payer: Self-pay | Admitting: Physical Therapy

## 2021-09-08 ENCOUNTER — Ambulatory Visit: Payer: Medicare PPO | Attending: Family Medicine | Admitting: Physical Therapy

## 2021-09-08 DIAGNOSIS — R2689 Other abnormalities of gait and mobility: Secondary | ICD-10-CM | POA: Insufficient documentation

## 2021-09-08 NOTE — Therapy (Signed)
[facility] [address] [place], [place], [address] Phone: [phone]   Fax:  [phone]  Physical Therapy Evaluation  Patient Details  Name: Jesse Hampton MRN: [id number] Date of Birth: [birth year] Referring Provider (PT): Farris Has, MD   Encounter Date: 09/08/2021   PT End of Session - 09/08/21 1208     Visit Number 1    Date for PT Re-Evaluation 10/20/21    Authorization Type [employer]    Authorization Time Period requesting 2x/week x 6 weeks    Progress Note Due on Visit 10    PT Start Time 1100    PT Stop Time 1153    PT Time Calculation (min) 53 min    Activity Tolerance Patient tolerated treatment well    Behavior During Therapy [facility] for tasks assessed/performed             Past Medical History:  Diagnosis Date   Anxiety    Arthritis    hands,   Chronic kidney disease    kidney stones   Depression     Past Surgical History:  Procedure Laterality Date   EYE SURGERY  2006   cartaract bil.    There were no vitals filed for this visit.    Subjective Assessment - 09/08/21 1103     Subjective Pt has had two falls in the last month.  This is new for him.  First fall was due to ice on the steps, falling backwards.  Second fall was perhaps due to dizziness while walking down hallway at home and fell backwards when he couldn't catch himself.  Pt walks 1.5 miles daily with his dog.  I do get momentary dizziness which I feel like may have to do with my eyes.  I also feel like I may have less awareness of where I am when I am walking.    Pertinent History PMH: HOH, prostate cancer w/ radiation 11 years ago    How long can you walk comfortably? walks 1.5 miles daily    Patient Stated Goals avoid AD, work on vestibular system if it is needed, balance                [facility] PT Assessment - 09/08/21 0001       Assessment   Medical Diagnosis R26.89 (ICD-10-CM) - Other abnormalities of  gait and mobility    Referring Provider (PT) Farris Has, MD      Precautions   Precautions Fall      Balance Screen   Has the patient fallen in the past 6 months Yes    How many times? 2    Has the patient had a decrease in activity level because of a fear of falling?  No    Is the patient reluctant to leave their home because of a fear of falling?  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    Leisure walks dog 1.5 miles daily      Cognition   Overall Cognitive Status Within Functional Limits for tasks assessed      Observation/Other Assessments   Focus on  Therapeutic Outcomes (FOTO)  ABC balance scale: 77.5% confidence      Posture/Postural Control   Posture/Postural Control Postural limitations    Postural Limitations Flexed trunk;Rounded Shoulders;Forward head;Increased thoracic kyphosis      ROM / Strength   AROM / PROM / Strength AROM;Strength      AROM   Overall AROM Comments trunk ROM grossly limited 50-70%, bil hips grossly limited 50%      Strength   Overall Strength Comments bil LEs grossly 4+/5      Flexibility   Soft Tissue Assessment /Muscle Length yes    Hamstrings limited 50% bil    Piriformis limited 30% bil      Ambulation/Gait   Ambulation/Gait Yes    Ambulation/Gait Assistance 7: Independent    Assistive device None    Gait Pattern Step-through pattern;Decreased stride length;Right foot flat;Left foot flat;Trunk flexed      Standardized Balance Assessment   Standardized Balance Assessment Timed Up and Go Test;Five Times Sit to Stand;Dynamic Gait Index;Berg Balance Test    Five times sit to stand comments  14 sec      Berg Balance Test   Sit to Stand Able to stand  without using hands and stabilize independently    Standing Unsupported Able to stand safely 2 minutes    Sitting with Back Unsupported but Feet Supported on Floor or Stool Able to sit safely and securely 2 minutes    Stand to Sit Sits safely with minimal use of hands    Transfers Able to transfer safely, minor use of hands    Standing Unsupported with Eyes Closed Able to stand 10 seconds safely    Standing Unsupported with Feet Together Able to place feet together independently and stand 1 minute safely    From Standing, Reach Forward with Outstretched Arm Can reach confidently >25 cm (10")    From Standing Position, Pick up Object from Floor Able to pick up shoe, needs supervision    From Standing Position, Turn to Look Behind Over each Shoulder Looks behind one side only/other side shows less weight shift    Turn 360 Degrees Able to turn 360 degrees safely in 4 seconds or less    Standing Unsupported, Alternately Place Feet on Step/Stool Able to stand independently and safely and complete 8 steps in 20 seconds    Standing Unsupported, One Foot in Front Able to take small step independently and hold 30 seconds    Standing on One Leg Tries to lift leg/unable to hold 3 seconds but remains standing independently    Total Score 49      Dynamic Gait Index   Level Surface Normal    Change in Gait Speed Mild Impairment    Gait with Horizontal Head Turns Moderate Impairment    Gait with Vertical Head Turns Mild Impairment    Gait and Pivot Turn Mild Impairment    Step Over Obstacle Mild Impairment    Step Around Obstacles Mild Impairment    Steps Normal    Total Score 17    DGI comment: 17/24      Timed Up and Go Test   TUG Normal TUG    Normal TUG (seconds) 15      High Level Balance   High Level Balance Comments vestibular challenge with convergence and upward eye tracking                        Objective measurements completed on examination: See  above findings.                 PT Education - 09/08/21 1157     Education Details Access Code: [email or web address]    Person(s) Educated Patient    Methods Explanation;Demonstration;Handout    Comprehension Verbalized understanding;Returned demonstration              PT Short Term Goals - 09/08/21 1217       PT SHORT TERM GOAL #1   Title Pt ind with initial HEP    Time 3    Period Weeks    Status New    Target Date 09/29/21      PT SHORT TERM GOAL #2   Title Pt able to balance in SLS on Rt/Lt LEs with intermittent UE support x 30 sec    Time 4    Period Weeks    Status New    Target Date 10/06/21      PT SHORT TERM GOAL #3   Title Pt able to perform tandem gait fwd and bwd along counter with supervision and intermittent UE support x 3 passes without signif LOB.    Time 4    Period Weeks    Status New    Target Date 10/06/21               PT Long Term Goals - 09/08/21 1217       PT LONG TERM GOAL #1   Title Pt will achieve trunk and hip mobility to [facility] to reduce resistance for functional tasks.    Time 6    Period Weeks    Status New    Target Date 10/20/21      PT LONG TERM GOAL #2   Title Pt will achieve 5x sit to stand in 12 sec and TUG in 13 sec to demo reduced fall risk.    Time 6    Period Weeks    Status New    Target Date 10/20/21      PT LONG TERM GOAL #3   Title Pt will be able to demo multi-directional stepping x 5 cycles each on Rt and Lt stabilizing LEs without LOB    Time 6    Period Weeks    Status New    Target Date 10/20/21      PT LONG TERM GOAL #4   Title Pt will improve DGI score to at least 21/24 to demo improved dynamic balance with gait challenges    Time 8    Period Weeks    Status New    Target Date 11/03/21      PT LONG TERM GOAL #5   Title Pt will score at least 85% on ABC scale to demo improved balance confidence with varied challenges    Baseline 77.5%    Time 6    Period Weeks    Status New    Target Date 10/20/21                     Plan - 09/08/21 1208     Clinical Impression Statement Pt is a pleasant 87yo male with history of 2 falls in past month.  Pt slipped and fell on icy stairs for first fall and fell in hallway at home when he became dizzy for second fall.  Pt states he feels his Lt/Rt brain coordination and vestibular system may be off.  Pt presents with signif stiffness in all planes of trunk and bil  hip mobility.  He has limited LE flexibility.  His 5x sit to stand is 14 sec w/o UE use, and TUG is 15 sec.  He scored 17/24 on DGI with most challenge with gait with head turns Lt/Rt.  BERG balance score was 49/56 with most challenge with tasks requiring mobility such as retrieving object from floor and with SLS and tandem stance.  Pt appears to have some eye tracking difficulty with convergence and upward tracking.  Pt has 4+/5 strength of bil LEs.  Pt's ABC balance scale reflects 77.5% self-confidence in his balance.  Pt will benefit from skilled PT targeting further vestibular work up/training as needed, balance and coordination challenges, and cognitive overlay with balance challenges, and functional mobility to reduce overall stiffness.    Personal Factors and Comorbidities Age;Comorbidity 1    Comorbidities Hx of prostate cancer/radiation 11 years ago    Examination-Activity Limitations Squat;Bend;Locomotion Level    Examination-Participation Restrictions Psychiatric nurse;Shop;Yard Work    Stability/Clinical Decision Making Stable/Uncomplicated    Clinical Decision Making Low    Rehab Potential Excellent    PT Frequency 2x / week    PT Duration 6 weeks    PT Treatment/Interventions ADLs/Self Care Home Management;Moist Heat;Gait training;Stair training;Functional mobility training;Therapeutic activities;Therapeutic exercise;Balance training;Neuromuscular re-education;Patient/family education;Manual techniques;Passive range of motion;Spinal Manipulations;Joint Manipulations     PT Next Visit Plan futher work up of vestibular system and make goal if deficits found, add trunk and hip mobility/stretches to HEP, review HEP, gait with head turns, hurdle step over and back, balance discs multi-directional stepping, progress tandem balance to tandem gait along counter, SLS with star taps    PT Home Exercise Plan Access Code: [email or web address]    Consulted and Agree with Plan of Care Patient             Patient will benefit from skilled therapeutic intervention in order to improve the following deficits and impairments:  Decreased coordination, Decreased range of motion, Postural dysfunction, Decreased mobility, Decreased balance, Impaired flexibility  Visit Diagnosis: Other abnormalities of gait and mobility - Plan: PT plan of care cert/re-cert     Problem List Patient Active Problem List   Diagnosis Date Noted   Family history of Alzheimer's disease 09/20/2016   Meniere disease 04/15/2016   Lumbar radiculopathy 07/07/2015   Routine general medical examination at a health care facility 04/02/2015   Thyroid nodule 04/02/2015   Chronic pruritus 12/25/2014   Benign prostatic hyperplasia 11/16/2014   Hyperlipidemia 11/16/2014   Major depression in remission (HCC) 11/16/2014   Allergic rhinitis 11/16/2014    Morton Peters, PT 09/08/21 12:28 PM   [facility] @ [facility] [address] [place], [place], [address] Phone: [phone]   Fax:  [phone]  Name: Jesse Hampton MRN: [id number] Date of Birth: 02-27-1930

## 2021-09-08 NOTE — Patient Instructions (Signed)
Access Code: [email or web address] URL: [email or web address] Date: 09/08/2021 Prepared by: Loistine Simas Achille Xiang  Exercises Standing Single Leg Stance with Counter Support - 1 x daily - 7 x weekly - 1 sets - 2 reps - 30 hold Standing Tandem Balance with Counter Support - 1 x daily - 7 x weekly - 1 sets - 2 reps - 30 hold Standing March with Counter Support - 1 x daily - 7 x weekly - 1 sets - 20 reps Seated Heel Toe Raises - 1 x daily - 7 x weekly - 1 sets - 20 reps Seated Hamstring Stretch - 1 x daily - 7 x weekly - 1 sets - 2 reps - 20 hold

## 2021-09-28 ENCOUNTER — Ambulatory Visit: Payer: Medicare PPO | Attending: Family Medicine | Admitting: Physical Therapy

## 2021-09-28 ENCOUNTER — Other Ambulatory Visit: Payer: Self-pay

## 2021-09-28 ENCOUNTER — Encounter: Payer: Self-pay | Admitting: Physical Therapy

## 2021-09-28 DIAGNOSIS — R2689 Other abnormalities of gait and mobility: Secondary | ICD-10-CM | POA: Diagnosis not present

## 2021-09-28 NOTE — Therapy (Signed)
[facility] @ [facility] [address] [place], [place], [address] Phone: [phone]   Fax:  [phone]  Physical Therapy Treatment  Patient Details  Name: Jesse Hampton MRN: [id number] Date of Birth: 10-Dec-1929 Referring Provider (PT): Farris Has, MD   Encounter Date: 09/28/2021   PT End of Session - 09/28/21 1133     Visit Number 2    Date for PT Re-Evaluation 10/20/21    Authorization Type [employer]    Authorization Time Period 12 visits between 09/08/21-10/20/21    Authorization - Visit Number 2    Authorization - Number of Visits 12    Progress Note Due on Visit 10    PT Start Time 1140    PT Stop Time 1228    PT Time Calculation (min) 48 min    Activity Tolerance Patient tolerated treatment well    Behavior During Therapy [facility] for tasks assessed/performed             Past Medical History:  Diagnosis Date   Anxiety    Arthritis    hands,   Chronic kidney disease    kidney stones   Depression     Past Surgical History:  Procedure Laterality Date   EYE SURGERY  2006   cartaract bil.    There were no vitals filed for this visit.   Subjective Assessment - 09/28/21 1133     Pertinent History PMH: HOH, prostate cancer w/ radiation 11 years ago    How long can you walk comfortably? walks 1.5 miles daily    Patient Stated Goals avoid AD, work on vestibular system if it is needed, balance                               [facility] Adult PT Treatment/Exercise - 09/28/21 0001       Neuro Re-ed    Neuro Re-ed Details  eye convergence with popsicle stick dot: 10sec hold at arms length, then slowly bring closer until challenge for clarity and single object x 10 sec, repeat 10 reps (HEP)      Exercises   Exercises Knee/Hip;Lumbar      Lumbar Exercises: Stretches   Other Lumbar Stretch Exercise seated ball rollouts for trunk flexion 3-ways x 5 each      Lumbar Exercises: Aerobic    Nustep L3 x 4' PT present to discuss initial HEP      Lumbar Exercises: Standing   Other Standing Lumbar Exercises step up/up/down/down across blue foam mat x 10 reps, bil UE support edge of treadmill    Other Standing Lumbar Exercises march standing on blue foam pad x 20, bil UE support      Knee/Hip Exercises: Standing   Rocker Board 2 minutes    Rocker Board Limitations ant/post    SLS with Vectors 3-way step to floor discs fwd/lat/bwd x 5 rounds each on Rt/Lt leg, then added ipsilateral arm reach with fwd step 5" holds x 3 rounds each Rt/Lt, then toe touch taps to each disc 3-way x 5 rounds each    Rebounder 3-way weight shift with march (lateral and stagger), bil UE support, x 30" each stance                 Balance Exercises - 09/28/21 0001       Balance Exercises: Standing   Tandem Stance Eyes open;Upper extremity support 2;30 secs;1 rep   with  head turns Lt/Rt, parallel bars   SLS Eyes open;Solid surface;Intermittent upper extremity support;30 secs;1 rep    Gait with Head Turns Forward;2 reps   length of hallway x 2   Tandem Gait Forward;Retro;2 reps   in parallel bars               PT Education - 09/28/21 1229     Education Details eye convergence exercise 10 rounds 10 sec at arms length and 10 sec at closer distance while popsicle stick dot still clear and single (not double), x 5' up to twice daily    Person(s) Educated Patient    Methods Explanation;Handout;Demonstration    Comprehension Verbalized understanding;Returned demonstration              PT Short Term Goals - 09/28/21 1244       PT SHORT TERM GOAL #1   Title Pt ind with initial HEP    Status On-going      PT SHORT TERM GOAL #2   Title Pt able to balance in SLS on Rt/Lt LEs with intermittent UE support x 30 sec    Status Achieved      PT SHORT TERM GOAL #3   Title Pt able to perform tandem gait fwd and bwd along counter with supervision and intermittent UE support x 3 passes without  signif LOB.    Status On-going               PT Long Term Goals - 09/08/21 1217       PT LONG TERM GOAL #1   Title Pt will achieve trunk and hip mobility to [facility] to reduce resistance for functional tasks.    Time 6    Period Weeks    Status New    Target Date 10/20/21      PT LONG TERM GOAL #2   Title Pt will achieve 5x sit to stand in 12 sec and TUG in 13 sec to demo reduced fall risk.    Time 6    Period Weeks    Status New    Target Date 10/20/21      PT LONG TERM GOAL #3   Title Pt will be able to demo multi-directional stepping x 5 cycles each on Rt and Lt stabilizing LEs without LOB    Time 6    Period Weeks    Status New    Target Date 10/20/21      PT LONG TERM GOAL #4   Title Pt will improve DGI score to at least 21/24 to demo improved dynamic balance with gait challenges    Time 8    Period Weeks    Status New    Target Date 11/03/21      PT LONG TERM GOAL #5   Title Pt will score at least 85% on ABC scale to demo improved balance confidence with varied challenges    Baseline 77.5%    Time 6    Period Weeks    Status New    Target Date 10/20/21                   Plan - 09/28/21 1239     Clinical Impression Statement Pt has been compliant with initial HEP.  PT progressed gait and balance challenges today with gait with head turns, tandem stance with head turns, compliant surface stabilization, and multi-directional stepping with arm reach.  Pt grows challenged with demands of being in SLS such as toe  taps to floor discs without weight transfer into stepping foot.  PT initiated eye convergence exercise today and added to HEP.  Pt reported eye fatigue after 10 reps.  Continue along POC.    Comorbidities Hx of prostate cancer/radiation 11 years ago    Rehab Potential Excellent    PT Frequency 2x / week    PT Duration 6 weeks    PT Treatment/Interventions ADLs/Self Care Home Management;Moist Heat;Gait training;Stair training;Functional mobility  training;Therapeutic activities;Therapeutic exercise;Balance training;Neuromuscular re-education;Patient/family education;Manual techniques;Passive range of motion;Spinal Manipulations;Joint Manipulations    PT Next Visit Plan f/u on eye convergence exercise from last time (HEP), continue gait stability with head turns and narrow BOS, multi-directional stepping and step overs, LE and trunk flexiblity    PT Home Exercise Plan Access Code: [email or web address]    Consulted and Agree with Plan of Care Patient             Patient will benefit from skilled therapeutic intervention in order to improve the following deficits and impairments:     Visit Diagnosis: Other abnormalities of gait and mobility     Problem List Patient Active Problem List   Diagnosis Date Noted   Family history of Alzheimer's disease 09/20/2016   Meniere disease 04/15/2016   Lumbar radiculopathy 07/07/2015   Routine general medical examination at a health care facility 04/02/2015   Thyroid nodule 04/02/2015   Chronic pruritus 12/25/2014   Benign prostatic hyperplasia 11/16/2014   Hyperlipidemia 11/16/2014   Major depression in remission (HCC) 11/16/2014   Allergic rhinitis 11/16/2014    Morton Peters, PT 09/28/21 12:45 PM   [facility] [address] [place], [place], [address] Phone: [phone]   Fax:  [phone]  Name: Brevon Dewald Wojnar MRN: [id number] Date of Birth: 1930/01/02

## 2021-09-28 NOTE — Patient Instructions (Signed)
Eye convergence exercise: Hold popsicle stick with black dot out at arms length at eye level. Look at small object on far wall x 10 sec. Then focus on black dot x 10 sec Slowly bring stick closer until it is still clear and single - hold x 10 sec Repeat stick in/out x 10 sec each until you've done them 10 times, then re-focus on far wall again. Repeat for up to 5 min, twice daily.

## 2021-09-30 ENCOUNTER — Other Ambulatory Visit: Payer: Self-pay

## 2021-09-30 ENCOUNTER — Ambulatory Visit: Payer: Medicare PPO

## 2021-09-30 DIAGNOSIS — R2689 Other abnormalities of gait and mobility: Secondary | ICD-10-CM

## 2021-09-30 NOTE — Therapy (Signed)
[facility] @ [facility] [address] [place], [place], [address] Phone: [phone]   Fax:  [phone]  Physical Therapy Treatment  Patient Details  Name: Jesse Hampton MRN: [id number] Date of Birth: 1930-04-06 Referring Provider (PT): Farris Has, MD   Encounter Date: 09/30/2021   PT End of Session - 09/30/21 1201     Visit Number 3    Date for PT Re-Evaluation 10/20/21    Authorization Type [employer]    Authorization Time Period 12 visits between 09/08/21-10/20/21    Authorization - Visit Number 2    Authorization - Number of Visits 12    Progress Note Due on Visit 10    PT Start Time 1100    PT Stop Time 1142    PT Time Calculation (min) 42 min    Activity Tolerance Patient tolerated treatment well    Behavior During Therapy [facility] for tasks assessed/performed             Past Medical History:  Diagnosis Date   Anxiety    Arthritis    hands,   Chronic kidney disease    kidney stones   Depression     Past Surgical History:  Procedure Laterality Date   EYE SURGERY  2006   cartaract bil.    There were no vitals filed for this visit.   Subjective Assessment - 09/30/21 1104     Subjective Patient states he continues to have some imbalance issues but no falls.  He is still walking his dog every morning.  He states the severe dizziness is resolved.    Pertinent History PMH: HOH, prostate cancer w/ radiation 11 years ago    How long can you walk comfortably? walks 1.5 miles daily    Patient Stated Goals avoid AD, work on vestibular system if it is needed, balance    Currently in Pain? No/denies                               [facility] Adult PT Treatment/Exercise - 09/30/21 0001       Posture/Postural Control   Posture/Postural Control Postural limitations    Postural Limitations Flexed trunk;Rounded Shoulders;Forward head;Increased thoracic kyphosis      High Level Balance   High  Level Balance Activities Side stepping;Negotiating over obstacles    High Level Balance Comments 3 cone tap ups x 3 rounds each LE with single UE support      Neuro Re-ed    Neuro Re-ed Details  fixed gaze with head turns, head fixed with tracking all directions x 10 each      Exercises   Exercises Knee/Hip;Lumbar      Lumbar Exercises: Stretches   Other Lumbar Stretch Exercise seated ball rollouts for trunk flexion 3-ways x 5 each      Lumbar Exercises: Aerobic   Nustep L5 x 5' PT present to discuss initial HEP      Lumbar Exercises: Standing   Other Standing Lumbar Exercises step up/up/down/down across blue foam mat x 10 reps, bil UE support edge of treadmill    Other Standing Lumbar Exercises march standing on blue foam pad x 20, bil UE support                     PT Education - 09/30/21 1113     Education Details Educated patient on how stiff posture can affect balance.  Patient had  multiple questions today about neural pathways and affect on motor control.  He does not understand why his movements are so slow and guarded.  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  PT spent as much as 10-12 min discussing this topic and answering questions.    Person(s) Educated Patient    Methods Explanation    Comprehension Verbalized understanding;Need further instruction              PT Short Term Goals - 09/28/21 1244       PT SHORT TERM GOAL #1   Title Pt ind with initial HEP    Status On-going      PT SHORT TERM GOAL #2   Title Pt able to balance in SLS on Rt/Lt LEs with intermittent UE support x 30 sec    Status Achieved      PT SHORT TERM GOAL #3   Title Pt able to perform tandem gait fwd and bwd along counter with supervision and intermittent UE support x 3 passes without signif LOB.    Status On-going               PT Long Term Goals - 09/08/21 1217       PT LONG TERM GOAL #1   Title Pt will achieve trunk  and hip mobility to [facility] to reduce resistance for functional tasks.    Time 6    Period Weeks    Status New    Target Date 10/20/21      PT LONG TERM GOAL #2   Title Pt will achieve 5x sit to stand in 12 sec and TUG in 13 sec to demo reduced fall risk.    Time 6    Period Weeks    Status New    Target Date 10/20/21      PT LONG TERM GOAL #3   Title Pt will be able to demo multi-directional stepping x 5 cycles each on Rt and Lt stabilizing LEs without LOB    Time 6    Period Weeks    Status New    Target Date 10/20/21      PT LONG TERM GOAL #4   Title Pt will improve DGI score to at least 21/24 to demo improved dynamic balance with gait challenges    Time 8    Period Weeks    Status New    Target Date 11/03/21      PT LONG TERM GOAL #5   Title Pt will score at least 85% on ABC scale to demo improved balance confidence with varied challenges    Baseline 77.5%    Time 6    Period Weeks    Status New    Target Date 10/20/21                   Plan - 09/30/21 1202     Clinical Impression Statement Patient was able to progress with balance activities but fatigued easily needing frequent rest breaks.  His base of support seems to be posterior wich causes him to pitch backwards when losing his balance.  He would benefit from continued skilled PT for balance and proprioception activities.    Personal Factors and Comorbidities Age;Comorbidity 1    Comorbidities Hx of prostate cancer/radiation 11 years ago    Examination-Activity Limitations Squat;Bend;Locomotion Level    Examination-Participation Restrictions Psychiatric nurse;Shop;Yard Work    Conservation officer, historic buildings Stable/Uncomplicated  Clinical Decision Making Low    Rehab Potential Excellent    PT Frequency 2x / week    PT Duration 6 weeks    PT Treatment/Interventions ADLs/Self Care Home Management;Moist Heat;Gait training;Stair training;Functional mobility training;Therapeutic  activities;Therapeutic exercise;Balance training;Neuromuscular re-education;Patient/family education;Manual techniques;Passive range of motion;Spinal Manipulations;Joint Manipulations    PT Next Visit Plan Progress eye exercises, continue gait stability and proprioceptive activities.    PT Home Exercise Plan Access Code: [email or web address]    Consulted and Agree with Plan of Care Patient             Patient will benefit from skilled therapeutic intervention in order to improve the following deficits and impairments:  Decreased coordination, Decreased range of motion, Postural dysfunction, Decreased mobility, Decreased balance, Impaired flexibility  Visit Diagnosis: Other abnormalities of gait and mobility     Problem List Patient Active Problem List   Diagnosis Date Noted   Family history of Alzheimer's disease 09/20/2016   Meniere disease 04/15/2016   Lumbar radiculopathy 07/07/2015   Routine general medical examination at a health care facility 04/02/2015   Thyroid nodule 04/02/2015   Chronic pruritus 12/25/2014   Benign prostatic hyperplasia 11/16/2014   Hyperlipidemia 11/16/2014   Major depression in remission (HCC) 11/16/2014   Allergic rhinitis 11/16/2014    Vernell Barrier, PT 09/30/2021, 12:15 PM  [facility] @ [facility] [address] [place], [place], [address] Phone: [phone]   Fax:  [phone]  Name: Severn Goddard Daoust MRN: [id number] Date of Birth: [birth year]

## 2021-09-30 NOTE — Patient Instructions (Signed)
Continue current HEP 

## 2021-10-05 ENCOUNTER — Ambulatory Visit: Payer: Medicare PPO

## 2021-10-05 ENCOUNTER — Other Ambulatory Visit: Payer: Self-pay

## 2021-10-05 DIAGNOSIS — R2689 Other abnormalities of gait and mobility: Secondary | ICD-10-CM

## 2021-10-05 NOTE — Therapy (Signed)
[facility] @ [facility] [address] [place], [place], [address] Phone: [phone]   Fax:  [phone]  Physical Therapy Treatment  Patient Details  Name: Jesse Hampton MRN: [id number] Date of Birth: 1930/07/02 Referring Provider (PT): Farris Has, MD   Encounter Date: 10/05/2021   PT End of Session - 10/05/21 1452     Visit Number 4    Date for PT Re-Evaluation 10/20/21    Authorization Type [employer]    Authorization Time Period 12 visits between 09/08/21-10/20/21    Authorization - Visit Number 4    Authorization - Number of Visits 12    PT Start Time 1445    PT Stop Time 1530    PT Time Calculation (min) 45 min    Activity Tolerance Patient tolerated treatment well    Behavior During Therapy [facility] for tasks assessed/performed             Past Medical History:  Diagnosis Date   Anxiety    Arthritis    hands,   Chronic kidney disease    kidney stones   Depression     Past Surgical History:  Procedure Laterality Date   EYE SURGERY  2006   cartaract bil.    There were no vitals filed for this visit.   Subjective Assessment - 10/05/21 1452     Subjective Patient rates his condition today as a "B".  He complains of some right hand pain. "The rest of me feels pretty good".    Pertinent History PMH: HOH, prostate cancer w/ radiation 11 years ago    How long can you walk comfortably? walks 1.5 miles daily    Patient Stated Goals avoid AD, work on vestibular system if it is needed, balance    Currently in Pain? No/denies                               [facility] Adult PT Treatment/Exercise - 10/05/21 0001       Posture/Postural Control   Posture/Postural Control Postural limitations    Postural Limitations Flexed trunk;Rounded Shoulders;Forward head;Increased thoracic kyphosis      High Level Balance   High Level Balance Activities Other (comment)    High Level Balance Comments 3  cone tap ups x 3 rounds each LE with single UE support      Exercises   Exercises Knee/Hip;Lumbar      Lumbar Exercises: Stretches   Other Lumbar Stretch Exercise seated ball rollouts for trunk flexion 3-ways x 5 each      Lumbar Exercises: Aerobic   Nustep L5 x 5' PT present to discuss initial HEP      Lumbar Exercises: Standing   Other Standing Lumbar Exercises step up/up/down/down across blue foam mat x 10 reps, bil UE support edge of treadmill    Other Standing Lumbar Exercises march standing on blue foam pad x 20, bil UE support      Knee/Hip Exercises: Machines for Strengthening   Cybex Leg Press 50 lbs bilateral x 20 seat 7      Knee/Hip Exercises: Standing   Rocker Board 2 minutes    Rocker Board Limitations ant/post and s/s x 1 min each    SLS with Vectors 3 cones touch in SLS x 5 each with single UE support, then no UE support    Rebounder ball toss straight fwd red ball x 20 dbl leg stance  PT Short Term Goals - 09/28/21 1244       PT SHORT TERM GOAL #1   Title Pt ind with initial HEP    Status On-going      PT SHORT TERM GOAL #2   Title Pt able to balance in SLS on Rt/Lt LEs with intermittent UE support x 30 sec    Status Achieved      PT SHORT TERM GOAL #3   Title Pt able to perform tandem gait fwd and bwd along counter with supervision and intermittent UE support x 3 passes without signif LOB.    Status On-going               PT Long Term Goals - 09/08/21 1217       PT LONG TERM GOAL #1   Title Pt will achieve trunk and hip mobility to [facility] to reduce resistance for functional tasks.    Time 6    Period Weeks    Status New    Target Date 10/20/21      PT LONG TERM GOAL #2   Title Pt will achieve 5x sit to stand in 12 sec and TUG in 13 sec to demo reduced fall risk.    Time 6    Period Weeks    Status New    Target Date 10/20/21      PT LONG TERM GOAL #3   Title Pt will be able to demo multi-directional  stepping x 5 cycles each on Rt and Lt stabilizing LEs without LOB    Time 6    Period Weeks    Status New    Target Date 10/20/21      PT LONG TERM GOAL #4   Title Pt will improve DGI score to at least 21/24 to demo improved dynamic balance with gait challenges    Time 8    Period Weeks    Status New    Target Date 11/03/21      PT LONG TERM GOAL #5   Title Pt will score at least 85% on ABC scale to demo improved balance confidence with varied challenges    Baseline 77.5%    Time 6    Period Weeks    Status New    Target Date 10/20/21                   Plan - 10/05/21 1627     Clinical Impression Statement Mohan is progressing appropriately. He was able to complete modified CTSIB with no loss of balance on any of the 4 tasks.  He demonstrates improved stability with all balance activities and is very compliant with his HEP.  He would benefit from continued formal PT to address balance deficits and flexibility issues.    Personal Factors and Comorbidities Age;Comorbidity 1    Comorbidities Hx of prostate cancer/radiation 11 years ago    Examination-Activity Limitations Squat;Bend;Locomotion Level    Examination-Participation Restrictions Psychiatric nurse;Shop;Yard Work    Stability/Clinical Decision Making Stable/Uncomplicated    Clinical Decision Making Low    Rehab Potential Excellent    PT Frequency 2x / week    PT Duration 6 weeks    PT Treatment/Interventions ADLs/Self Care Home Management;Moist Heat;Gait training;Stair training;Functional mobility training;Therapeutic activities;Therapeutic exercise;Balance training;Neuromuscular re-education;Patient/family education;Manual techniques;Passive range of motion;Spinal Manipulations;Joint Manipulations    PT Next Visit Plan Progress LE flexibility and balance tasks- add in single leg stance activities.    PT Home Exercise Plan Access Code: [email or web address]  Consulted and Agree with Plan of Care Patient              Patient will benefit from skilled therapeutic intervention in order to improve the following deficits and impairments:  Decreased coordination, Decreased range of motion, Postural dysfunction, Decreased mobility, Decreased balance, Impaired flexibility  Visit Diagnosis: Other abnormalities of gait and mobility     Problem List Patient Active Problem List   Diagnosis Date Noted   Family history of Alzheimer's disease 09/20/2016   Meniere disease 04/15/2016   Lumbar radiculopathy 07/07/2015   Routine general medical examination at a health care facility 04/02/2015   Thyroid nodule 04/02/2015   Chronic pruritus 12/25/2014   Benign prostatic hyperplasia 11/16/2014   Hyperlipidemia 11/16/2014   Major depression in remission (HCC) 11/16/2014   Allergic rhinitis 11/16/2014    Victorino Dike B. Ernest Popowski, PT 12/13/224:36 PM   [facility] @ [facility] [address] [place], [place], [address] Phone: [phone]   Fax:  [phone]  Name: Demeco Ducksworth MRN: [id number] Date of Birth: June 08, 1930

## 2021-10-07 ENCOUNTER — Other Ambulatory Visit: Payer: Self-pay

## 2021-10-07 ENCOUNTER — Ambulatory Visit: Payer: Medicare PPO

## 2021-10-07 DIAGNOSIS — R2689 Other abnormalities of gait and mobility: Secondary | ICD-10-CM | POA: Diagnosis not present

## 2021-10-07 NOTE — Therapy (Signed)
[facility] @ [facility] [address] [place], [place], [address] Phone: [phone]   Fax:  [phone]  Physical Therapy Treatment  Patient Details  Name: Jesse Hampton MRN: [id number] Date of Birth: August 03, 1930 Referring Provider (PT): Farris Has, MD   Encounter Date: 10/07/2021   PT End of Session - 10/07/21 1110     Visit Number 5    Date for PT Re-Evaluation 10/20/21    Authorization Type [employer]    Authorization Time Period 12 visits between 09/08/21-10/20/21    Authorization - Visit Number 5    Authorization - Number of Visits 12    PT Start Time 1103    PT Stop Time 1145    PT Time Calculation (min) 42 min    Activity Tolerance Patient tolerated treatment well    Behavior During Therapy [facility] for tasks assessed/performed             Past Medical History:  Diagnosis Date   Anxiety    Arthritis    hands,   Chronic kidney disease    kidney stones   Depression     Past Surgical History:  Procedure Laterality Date   EYE SURGERY  2006   cartaract bil.    There were no vitals filed for this visit.   Subjective Assessment - 10/07/21 1107     Subjective Patient states he is not doing well.  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  He denies any pain.    Pertinent History PMH: HOH, prostate cancer w/ radiation 11 years ago    How long can you walk comfortably? walks 1.5 miles daily    Patient Stated Goals avoid AD, work on vestibular system if it is needed, balance                               [facility] Adult PT Treatment/Exercise - 10/07/21 0001       Posture/Postural Control   Posture/Postural Control Postural limitations    Postural Limitations Flexed trunk;Rounded Shoulders;Forward head;Increased thoracic kyphosis      High Level Balance   High Level Balance Activities Side stepping;Negotiating over obstacles       Exercises   Exercises Knee/Hip;Lumbar      Lumbar Exercises: Stretches   Other Lumbar Stretch Exercise seated ball rollouts for trunk flexion 3-ways x 10 each      Lumbar Exercises: Aerobic   Nustep L5 x 5' PT present to discuss initial HEP      Lumbar Exercises: Standing   Other Standing Lumbar Exercises step up/up/down/down across blue foam mat x 10 reps, bil UE support edge of treadmill    Other Standing Lumbar Exercises march standing on blue foam pad x 20, bil UE support      Knee/Hip Exercises: Standing   Heel Raises Both;1 set;20 reps    SLS x 5 each LE attempting 10 sec hold without UE support    Rebounder ball toss straight fwd red ball x 20 dbl leg stance   had patient step left to right after each toss for reaching out of base of support   Walking with Sports Cord 10 lb x 5 backward and x 5 fwd    Other Standing Knee Exercises lateral band walks green loop x 2 laps of 10 feet   verbal cues to lead with heel for neutral foot  PT Education - 10/07/21 1148     Education Details Educated patient on antomy of vestibular system.    Person(s) Educated Patient    Methods Explanation    Comprehension Verbalized understanding              PT Short Term Goals - 09/28/21 1244       PT SHORT TERM GOAL #1   Title Pt ind with initial HEP    Status On-going      PT SHORT TERM GOAL #2   Title Pt able to balance in SLS on Rt/Lt LEs with intermittent UE support x 30 sec    Status Achieved      PT SHORT TERM GOAL #3   Title Pt able to perform tandem gait fwd and bwd along counter with supervision and intermittent UE support x 3 passes without signif LOB.    Status On-going               PT Long Term Goals - 09/08/21 1217       PT LONG TERM GOAL #1   Title Pt will achieve trunk and hip mobility to [facility] to reduce resistance for functional tasks.    Time 6    Period Weeks    Status New    Target Date 10/20/21      PT LONG TERM GOAL #2    Title Pt will achieve 5x sit to stand in 12 sec and TUG in 13 sec to demo reduced fall risk.    Time 6    Period Weeks    Status New    Target Date 10/20/21      PT LONG TERM GOAL #3   Title Pt will be able to demo multi-directional stepping x 5 cycles each on Rt and Lt stabilizing LEs without LOB    Time 6    Period Weeks    Status New    Target Date 10/20/21      PT LONG TERM GOAL #4   Title Pt will improve DGI score to at least 21/24 to demo improved dynamic balance with gait challenges    Time 8    Period Weeks    Status New    Target Date 11/03/21      PT LONG TERM GOAL #5   Title Pt will score at least 85% on ABC scale to demo improved balance confidence with varied challenges    Baseline 77.5%    Time 6    Period Weeks    Status New    Target Date 10/20/21                   Plan - 10/07/21 1113     Clinical Impression Statement Patient continues to demonstrate improved balance as he is now able to do side stepping with resistance without lob.  He is also able to do SLS x 10 sec each LE for 2-3 reps without lob.  He is diligent with his HEP and should continue to do well with skilled PT for higher level balance tasks, flexibility and LE strengthening.    Personal Factors and Comorbidities Age;Comorbidity 1    Comorbidities Hx of prostate cancer/radiation 11 years ago    Examination-Activity Limitations Squat;Bend;Locomotion Level    Examination-Participation Restrictions Psychiatric nurse;Shop;Yard Work    Stability/Clinical Decision Making Stable/Uncomplicated    Clinical Decision Making Low    Rehab Potential Excellent    PT Frequency 2x / week    PT Duration  6 weeks    PT Treatment/Interventions ADLs/Self Care Home Management;Moist Heat;Gait training;Stair training;Functional mobility training;Therapeutic activities;Therapeutic exercise;Balance training;Neuromuscular re-education;Patient/family education;Manual techniques;Passive range  of motion;Spinal Manipulations;Joint Manipulations    PT Next Visit Plan Progress LE flexibility and balance tasks- add in single leg stance activities.    PT Home Exercise Plan Access Code: [email or web address]             Patient will benefit from skilled therapeutic intervention in order to improve the following deficits and impairments:  Decreased coordination, Decreased range of motion, Postural dysfunction, Decreased mobility, Decreased balance, Impaired flexibility  Visit Diagnosis: Other abnormalities of gait and mobility     Problem List Patient Active Problem List   Diagnosis Date Noted   Family history of Alzheimer's disease 09/20/2016   Meniere disease 04/15/2016   Lumbar radiculopathy 07/07/2015   Routine general medical examination at a health care facility 04/02/2015   Thyroid nodule 04/02/2015   Chronic pruritus 12/25/2014   Benign prostatic hyperplasia 11/16/2014   Hyperlipidemia 11/16/2014   Major depression in remission (HCC) 11/16/2014   Allergic rhinitis 11/16/2014    Victorino Dike B. Marybel Alcott, PT 12/15/221:23 PM   [facility] @ [facility] [address] [place], [place], [address] Phone: [phone]   Fax:  [phone]  Name: Jesse Hampton MRN: [id number] Date of Birth: 11/12/1929

## 2021-10-19 ENCOUNTER — Ambulatory Visit: Payer: Medicare PPO

## 2021-10-19 ENCOUNTER — Other Ambulatory Visit: Payer: Self-pay

## 2021-10-19 DIAGNOSIS — R2689 Other abnormalities of gait and mobility: Secondary | ICD-10-CM | POA: Diagnosis not present

## 2021-10-19 NOTE — Therapy (Signed)
[facility] [address] [place], [place], [address] Phone: [phone]   Fax:  [phone]  Physical Therapy Recertification  Patient Details  Name: Jesse Hampton MRN: [id number] Date of Birth: May 13, 1930 Referring Provider (PT): Farris Has, MD   Encounter Date: 10/19/2021   PT End of Session - 10/19/21 1055     Visit Number 5    Date for PT Re-Evaluation 10/20/21    Authorization Type [employer]    Authorization Time Period 12 visits between 09/08/21-10/20/21 (Requested 7 more visits 10-21-21 through 11-12-21)    Authorization - Visit Number 6    Authorization - Number of Visits 12    Progress Note Due on Visit 10    PT Start Time 1011    PT Stop Time 1055    PT Time Calculation (min) 44 min    Activity Tolerance Patient tolerated treatment well    Behavior During Therapy [facility] for tasks assessed/performed             Past Medical History:  Diagnosis Date   Anxiety    Arthritis    hands,   Chronic kidney disease    kidney stones   Depression     Past Surgical History:  Procedure Laterality Date   EYE SURGERY  2006   cartaract bil.    There were no vitals filed for this visit.   Subjective Assessment - 10/19/21 1010     Subjective Patient states he is doing ok.  He expresses his fatigue with the holidays and family being around.  He confirms some neck stiffness as well.    Pertinent History PMH: HOH, prostate cancer w/ radiation 11 years ago    How long can you walk comfortably? walks 1.5 miles daily    Patient Stated Goals avoid AD, work on vestibular system if it is needed, balance                [facility] PT Assessment - 10/19/21 0001       Assessment   Medical Diagnosis R26.89 (ICD-10-CM) - Other abnormalities of gait and mobility    Referring Provider (PT) Farris Has, MD      Precautions   Precautions Fall      Cognition   Overall Cognitive Status Within Functional  Limits for tasks assessed      Observation/Other Assessments   Focus on Therapeutic Outcomes (FOTO)  ABC balance scale: 77.5% confidence      AROM   Overall AROM Comments trunk ROM grossly limited 50-70%, bil hips grossly limited 50%      Strength   Overall Strength Comments bil LEs grossly 4+/5      Flexibility   Soft Tissue Assessment /Muscle Length yes    Hamstrings limited 50% bil    Piriformis limited 30% bil      Standardized Balance Assessment   Standardized Balance Assessment Timed Up and Go Test;Five Times Sit to Stand;Dynamic Gait Index;Berg Balance Test    Five times sit to stand comments  13      Timed Up and Go Test   TUG Normal TUG    Normal TUG (seconds) 14      High Level Balance   High Level Balance Comments no longer having dizziness                           [facility] Adult PT Treatment/Exercise - 10/19/21 0001  Posture/Postural Control   Posture/Postural Control Postural limitations    Postural Limitations Flexed trunk;Rounded Shoulders;Forward head;Increased thoracic kyphosis      High Level Balance   High Level Balance Activities Side stepping;Negotiating over obstacles    High Level Balance Comments 3 cone tap ups x 3 rounds each LE with single UE support      Lumbar Exercises: Stretches   Active Hamstring Stretch Right;Left;3 reps;30 seconds    Active Hamstring Stretch Limitations standing at steps    Other Lumbar Stretch Exercise seated ball rollouts for trunk flexion 3-ways x 10 each      Lumbar Exercises: Aerobic   Nustep L5 x 5' PT present to discuss initial HEP      Lumbar Exercises: Standing   Other Standing Lumbar Exercises step up/up/down/down across blue foam mat x 10 reps, bil UE support edge of treadmill    Other Standing Lumbar Exercises march standing on blue foam pad x 20, bil UE support      Knee/Hip Exercises: Stretches   Piriformis Stretch Both;3 reps;30 seconds      Knee/Hip Exercises: Machines for  Strengthening   Cybex Leg Press 55 lbs bilateral x 20 seat 7      Knee/Hip Exercises: Standing   Heel Raises Both;1 set;20 reps    SLS with Vectors 3 cones touch in SLS x 5 each with single UE support, then no UE support    Other Standing Knee Exercises lateral band walks green loop x 2 laps of 10 feet   verbal cues to lead with heel for neutral foot                      PT Short Term Goals - 10/19/21 1509       PT SHORT TERM GOAL #1   Title Pt ind with initial HEP    Time 3    Period Weeks    Status Achieved    Target Date 09/29/21      PT SHORT TERM GOAL #2   Title Pt able to balance in SLS on Rt/Lt LEs with intermittent UE support x 30 sec    Time 4    Period Weeks    Status Achieved    Target Date 10/06/21      PT SHORT TERM GOAL #3   Title Pt able to perform tandem gait fwd and bwd along counter with supervision and intermittent UE support x 3 passes without signif LOB.    Time 4    Period Weeks    Status On-going    Target Date 10/06/21               PT Long Term Goals - 10/19/21 1510       PT LONG TERM GOAL #1   Title Pt will achieve trunk and hip mobility to [facility] to reduce resistance for functional tasks.    Time 6    Period Weeks    Status On-going    Target Date 11/12/21      PT LONG TERM GOAL #2   Title Pt will achieve 5x sit to stand in 12 sec and TUG in 13 sec to demo reduced fall risk.    Time 6    Period Weeks    Status On-going    Target Date 11/12/21      PT LONG TERM GOAL #3   Title Pt will be able to demo multi-directional stepping x 5 cycles each on Rt and Lt  stabilizing LEs without LOB    Time 6    Period Weeks    Status On-going    Target Date 11/12/21      PT LONG TERM GOAL #4   Title Pt will improve DGI score to at least 21/24 to demo improved dynamic balance with gait challenges    Time 8    Period Weeks    Status On-going    Target Date 11/12/21      PT LONG TERM GOAL #5   Title Pt will score at least 85%  on ABC scale to demo improved balance confidence with varied challenges    Baseline 77.5%    Time 6    Period Weeks    Status On-going    Target Date 11/12/21                   Plan - 10/19/21 1505     Clinical Impression Statement Mr. Markwood is showing appropriate progress.  His TUG score and 5 times sit to stand have improved by 1 second.  His strength is generally good bilateral LE's at 4 to 4+/5.  His primary issue is bilateral hip stability and balance.  He would benefit from continuing skilled PT for LE strengthening and balance training.  We are requesting a date extension for the remaining visits for a total of 12 visits.    Personal Factors and Comorbidities Age;Comorbidity 1    Comorbidities Hx of prostate cancer/radiation 11 years ago    Examination-Activity Limitations Squat;Bend;Locomotion Level    Examination-Participation Restrictions Psychiatric nurse;Shop;Yard Work    Stability/Clinical Decision Making Stable/Uncomplicated    Clinical Decision Making Low    Rehab Potential Excellent    PT Frequency 2x / week    PT Duration 4 weeks    PT Treatment/Interventions ADLs/Self Care Home Management;Moist Heat;Gait training;Stair training;Functional mobility training;Therapeutic activities;Therapeutic exercise;Balance training;Neuromuscular re-education;Patient/family education;Manual techniques;Passive range of motion;Spinal Manipulations;Joint Manipulations    PT Next Visit Plan Progress LE flexibility and balance tasks- add in single leg stance activities.    PT Home Exercise Plan Access Code: [email or web address]    Consulted and Agree with Plan of Care Patient             Patient will benefit from skilled therapeutic intervention in order to improve the following deficits and impairments:  Decreased coordination, Decreased range of motion, Postural dysfunction, Decreased mobility, Decreased balance, Impaired flexibility  Visit Diagnosis: Other  abnormalities of gait and mobility - Plan: PT plan of care cert/re-cert     Problem List Patient Active Problem List   Diagnosis Date Noted   Family history of Alzheimer's disease 09/20/2016   Meniere disease 04/15/2016   Lumbar radiculopathy 07/07/2015   Routine general medical examination at a health care facility 04/02/2015   Thyroid nodule 04/02/2015   Chronic pruritus 12/25/2014   Benign prostatic hyperplasia 11/16/2014   Hyperlipidemia 11/16/2014   Major depression in remission (HCC) 11/16/2014   Allergic rhinitis 11/16/2014     Victorino Dike B. Norene Oliveri, PT 12/27/223:16 PM   [facility] [address] [place], [place], [address] Phone: [phone]   Fax:  [phone]  Name: Jesse Hampton MRN: [id number] Date of Birth: April 14, 1930

## 2021-10-19 NOTE — Patient Instructions (Signed)
Added standing hamstring stretch as an option for prior to walking the dog.  Also added piriformis stretch due to hip rotation restriction.

## 2021-10-21 ENCOUNTER — Other Ambulatory Visit: Payer: Self-pay

## 2021-10-21 ENCOUNTER — Ambulatory Visit: Payer: Medicare PPO

## 2021-10-21 DIAGNOSIS — R2689 Other abnormalities of gait and mobility: Secondary | ICD-10-CM | POA: Diagnosis not present

## 2021-10-21 NOTE — Therapy (Signed)
[facility] [address] [place], [place], [address] Phone: [phone]   Fax:  [phone]  Physical Therapy Treatment  Patient Details  Name: Jesse Hampton MRN: [id number] Date of Birth: October 27, 1929 Referring Provider (PT): Farris Has, MD   Encounter Date: 10/21/2021   PT End of Session - 10/21/21 1019     Visit Number 6    Date for PT Re-Evaluation 11/12/21    Authorization Type [employer]    Authorization Time Period 12 visits between 09/08/21-10/20/21 (Requested 7 more visits 10-21-21 through 11-12-21)    Authorization - Visit Number 6    Authorization - Number of Visits 12    PT Start Time 1015    PT Stop Time 1055    PT Time Calculation (min) 40 min    Activity Tolerance Patient tolerated treatment well    Behavior During Therapy [facility] for tasks assessed/performed             Past Medical History:  Diagnosis Date   Anxiety    Arthritis    hands,   Chronic kidney disease    kidney stones   Depression     Past Surgical History:  Procedure Laterality Date   EYE SURGERY  2006   cartaract bil.    There were no vitals filed for this visit.   Subjective Assessment - 10/21/21 1018     Subjective No new complaints.  "feeling fair to [place]"    Pertinent History PMH: HOH, prostate cancer w/ radiation 11 years ago    Patient Stated Goals avoid AD, work on vestibular system if it is needed, balance    Currently in Pain? No/denies                               [facility] Adult PT Treatment/Exercise - 10/21/21 0001       High Level Balance   High Level Balance Comments 3 circle taps x 3 rounds each LE with single UE support      Lumbar Exercises: Stretches   Lower Trunk Rotation 5 reps;10 seconds    Piriformis Stretch 5 reps;10 seconds    Other Lumbar Stretch Exercise open book stretch x 5 each hold 10 sec.      Lumbar Exercises: Aerobic   Nustep L5 x 5' PT present to  discuss initial HEP      Knee/Hip Exercises: Stretches   Active Hamstring Stretch Both;5 reps;10 seconds    Active Hamstring Stretch Limitations supine using strap      Knee/Hip Exercises: Standing   Other Standing Knee Exercises lateral band walks green loop x 2 laps of 10 feet   verbal cues to lead with heel for neutral foot   Other Standing Knee Exercises monster walk with green loop countertop for single UE support                       PT Short Term Goals - 10/19/21 1509       PT SHORT TERM GOAL #1   Title Pt ind with initial HEP    Time 3    Period Weeks    Status Achieved    Target Date 09/29/21      PT SHORT TERM GOAL #2   Title Pt able to balance in SLS on Rt/Lt LEs with intermittent UE support x 30 sec    Time 4  Period Weeks    Status Achieved    Target Date 10/06/21      PT SHORT TERM GOAL #3   Title Pt able to perform tandem gait fwd and bwd along counter with supervision and intermittent UE support x 3 passes without signif LOB.    Time 4    Period Weeks    Status On-going    Target Date 10/06/21               PT Long Term Goals - 10/19/21 1510       PT LONG TERM GOAL #1   Title Pt will achieve trunk and hip mobility to [facility] to reduce resistance for functional tasks.    Time 6    Period Weeks    Status On-going    Target Date 11/12/21      PT LONG TERM GOAL #2   Title Pt will achieve 5x sit to stand in 12 sec and TUG in 13 sec to demo reduced fall risk.    Time 6    Period Weeks    Status On-going    Target Date 11/12/21      PT LONG TERM GOAL #3   Title Pt will be able to demo multi-directional stepping x 5 cycles each on Rt and Lt stabilizing LEs without LOB    Time 6    Period Weeks    Status On-going    Target Date 11/12/21      PT LONG TERM GOAL #4   Title Pt will improve DGI score to at least 21/24 to demo improved dynamic balance with gait challenges    Time 8    Period Weeks    Status On-going    Target Date  11/12/21      PT LONG TERM GOAL #5   Title Pt will score at least 85% on ABC scale to demo improved balance confidence with varied challenges    Baseline 77.5%    Time 6    Period Weeks    Status On-going    Target Date 11/12/21                   Plan - 10/21/21 1020     Clinical Impression Statement Patient did well today.  He demonstrates some lack of proprioception with foot placement on balance activities and marching but does very well with static balance.    Personal Factors and Comorbidities Age;Comorbidity 1    Comorbidities Hx of prostate cancer/radiation 11 years ago    Examination-Activity Limitations Squat;Bend;Locomotion Level    Examination-Participation Restrictions Psychiatric nurse;Shop;Yard Work    Stability/Clinical Decision Making Stable/Uncomplicated    Clinical Decision Making Low    Rehab Potential Excellent    PT Frequency 2x / week    PT Duration 4 weeks    PT Treatment/Interventions ADLs/Self Care Home Management;Moist Heat;Gait training;Stair training;Functional mobility training;Therapeutic activities;Therapeutic exercise;Balance training;Neuromuscular re-education;Patient/family education;Manual techniques;Passive range of motion;Spinal Manipulations;Joint Manipulations    PT Next Visit Plan Progress LE flexibility and balance tasks.  Started some trunk flexibility stretches last visit.    PT Home Exercise Plan Access Code: [email or web address]    Consulted and Agree with Plan of Care Patient             Patient will benefit from skilled therapeutic intervention in order to improve the following deficits and impairments:  Decreased coordination, Decreased range of motion, Postural dysfunction, Decreased mobility, Decreased balance, Impaired flexibility  Visit Diagnosis: Other abnormalities of gait and  mobility     Problem List Patient Active Problem List   Diagnosis Date Noted   Family history of Alzheimer's disease 09/20/2016    Meniere disease 04/15/2016   Lumbar radiculopathy 07/07/2015   Routine general medical examination at a health care facility 04/02/2015   Thyroid nodule 04/02/2015   Chronic pruritus 12/25/2014   Benign prostatic hyperplasia 11/16/2014   Hyperlipidemia 11/16/2014   Major depression in remission (HCC) 11/16/2014   Allergic rhinitis 11/16/2014    Victorino Dike B. Nikkol Pai, PT 10/21/2210:02 AM   [facility] [address] [place], [place], [address] Phone: [phone]   Fax:  [phone]  Name: Jesse Hampton MRN: [id number] Date of Birth: Aug 02, 1930

## 2021-10-26 ENCOUNTER — Ambulatory Visit: Payer: Medicare PPO | Attending: Family Medicine

## 2021-10-26 ENCOUNTER — Other Ambulatory Visit: Payer: Self-pay

## 2021-10-26 DIAGNOSIS — R2689 Other abnormalities of gait and mobility: Secondary | ICD-10-CM | POA: Insufficient documentation

## 2021-10-26 NOTE — Therapy (Signed)
[facility] [address] [place], [place], [address] Phone: [phone]   Fax:  [phone]  Physical Therapy Treatment  Patient Details  Name: Jesse Hampton MRN: [id number] Date of Birth: 01/01/1930 Referring Provider (PT): Farris HasMorrow, Aaron, MD   Encounter Date: 10/26/2021   PT End of Session - 10/26/21 1324     Visit Number 7    Date for PT Re-Evaluation 11/12/21    Authorization Type [employer]    Authorization Time Period 12 visits between 09/08/21-10/20/21 (Requested 7 more visits 10-21-21 through 11-12-21)    Authorization - Visit Number 7    Authorization - Number of Visits 12    PT Start Time 1145    PT Stop Time 1228    PT Time Calculation (min) 43 min    Activity Tolerance Patient tolerated treatment well    Behavior During Therapy [facility] for tasks assessed/performed             Past Medical History:  Diagnosis Date   Anxiety    Arthritis    hands,   Chronic kidney disease    kidney stones   Depression     Past Surgical History:  Procedure Laterality Date   EYE SURGERY  2006   cartaract bil.    There were no vitals filed for this visit.   Subjective Assessment - 10/26/21 1214     Subjective Patient states he is "tired" today.    Pertinent History PMH: HOH, prostate cancer w/ radiation 11 years ago    How long can you walk comfortably? walks 1.5 miles daily    Patient Stated Goals avoid AD, work on vestibular system if it is needed, balance    Currently in Pain? No/denies                               [facility] Adult PT Treatment/Exercise - 10/26/21 0001       Lumbar Exercises: Aerobic   Nustep L5 x 5' PT present to discuss initial HEP      Knee/Hip Exercises: Standing   Hip Extension Stengthening;Both;2 sets;10 reps    Functional Squat 2 sets;10 reps    Functional Squat Limitations on blue foam pad    Other Standing Knee Exercises lateral band walks green loop x  2 laps of 10 feet   verbal cues to lead with heel for neutral foot   Other Standing Knee Exercises monster walk with green loop countertop for single UE support      Knee/Hip Exercises: Seated   Long Arc Quad Strengthening;Both;1 set;20 reps    Long Arc Quad Weight 3 lbs.    Other Seated Knee/Hip Exercises seated IR strengthening with ball squeeze and green loop around ankles x 20      Knee/Hip Exercises: Sidelying   Hip ABduction Strengthening;Both;2 sets;10 reps                       PT Short Term Goals - 10/19/21 1509       PT SHORT TERM GOAL #1   Title Pt ind with initial HEP    Time 3    Period Weeks    Status Achieved    Target Date 09/29/21      PT SHORT TERM GOAL #2   Title Pt able to balance in SLS on Rt/Lt LEs with intermittent UE support x 30 sec  Time 4    Period Weeks    Status Achieved    Target Date 10/06/21      PT SHORT TERM GOAL #3   Title Pt able to perform tandem gait fwd and bwd along counter with supervision and intermittent UE support x 3 passes without signif LOB.    Time 4    Period Weeks    Status On-going    Target Date 10/06/21               PT Long Term Goals - 10/19/21 1510       PT LONG TERM GOAL #1   Title Pt will achieve trunk and hip mobility to [facility] to reduce resistance for functional tasks.    Time 6    Period Weeks    Status On-going    Target Date 11/12/21      PT LONG TERM GOAL #2   Title Pt will achieve 5x sit to stand in 12 sec and TUG in 13 sec to demo reduced fall risk.    Time 6    Period Weeks    Status On-going    Target Date 11/12/21      PT LONG TERM GOAL #3   Title Pt will be able to demo multi-directional stepping x 5 cycles each on Rt and Lt stabilizing LEs without LOB    Time 6    Period Weeks    Status On-going    Target Date 11/12/21      PT LONG TERM GOAL #4   Title Pt will improve DGI score to at least 21/24 to demo improved dynamic balance with gait challenges    Time 8     Period Weeks    Status On-going    Target Date 11/12/21      PT LONG TERM GOAL #5   Title Pt will score at least 85% on ABC scale to demo improved balance confidence with varied challenges    Baseline 77.5%    Time 6    Period Weeks    Status On-going    Target Date 11/12/21                   Plan - 10/26/21 1325     Clinical Impression Statement Patient did well with squat technique today and was able to maintain proper alignment indicating improved hip strength.  He struggled with sidelying hip abduction but was very attuned to proper technique.    Personal Factors and Comorbidities Age;Comorbidity 1    Comorbidities Hx of prostate cancer/radiation 11 years ago    Examination-Activity Limitations Squat;Bend;Locomotion Level    Examination-Participation Restrictions Psychiatric nurse;Shop;Yard Work    Stability/Clinical Decision Making Stable/Uncomplicated    Clinical Decision Making Low    Rehab Potential Excellent    PT Frequency 2x / week    PT Duration 4 weeks    PT Treatment/Interventions ADLs/Self Care Home Management;Moist Heat;Gait training;Stair training;Functional mobility training;Therapeutic activities;Therapeutic exercise;Balance training;Neuromuscular re-education;Patient/family education;Manual techniques;Passive range of motion;Spinal Manipulations;Joint Manipulations    PT Next Visit Plan Progress LE flexibility and balance tasks.  Started some trunk flexibility stretches last visit.    PT Home Exercise Plan Access Code: [email or web address]    Consulted and Agree with Plan of Care Patient             Patient will benefit from skilled therapeutic intervention in order to improve the following deficits and impairments:  Decreased coordination, Decreased range of motion, Postural dysfunction, Decreased mobility, Decreased  balance, Impaired flexibility  Visit Diagnosis: Other abnormalities of gait and mobility     Problem List Patient Active  Problem List   Diagnosis Date Noted   Family history of Alzheimer's disease 09/20/2016   Meniere disease 04/15/2016   Lumbar radiculopathy 07/07/2015   Routine general medical examination at a health care facility 04/02/2015   Thyroid nodule 04/02/2015   Chronic pruritus 12/25/2014   Benign prostatic hyperplasia 11/16/2014   Hyperlipidemia 11/16/2014   Major depression in remission (HCC) 11/16/2014   Allergic rhinitis 11/16/2014    Victorino Dike B. Wynelle Dreier, PT 01/03/231:29 PM   [facility] [address] [place], [place], [address] Phone: [phone]   Fax:  [phone]  Name: Jesse Hampton MRN: [id number] Date of Birth: August 27, 1930

## 2021-10-26 NOTE — Patient Instructions (Signed)
Updated HEP to include sidelying hip abduction: Access Code: [email or web address] URL: [email or web address] Date: 10/26/2021 Prepared by: Mikey Kirschner  Exercises Standing Single Leg Stance with Counter Support - 1 x daily - 7 x weekly - 1 sets - 2 reps - 30 hold Standing Tandem Balance with Counter Support - 1 x daily - 7 x weekly - 1 sets - 2 reps - 30 hold Standing March with Counter Support - 1 x daily - 7 x weekly - 1 sets - 20 reps Seated Heel Toe Raises - 1 x daily - 7 x weekly - 1 sets - 20 reps Seated Hamstring Stretch - 1 x daily - 7 x weekly - 1 sets - 2 reps - 20 hold Standing Hamstring Stretch on Chair - 2 x daily - 7 x weekly - 1 sets - 3 reps - 30 sec hold Supine Figure 4 Piriformis Stretch - 2 x daily - 7 x weekly - 1 sets - 3 reps - 30 sec hold Sidelying Hip Abduction - 2 x daily - 7 x weekly - 2 sets - 10 reps

## 2021-11-02 ENCOUNTER — Other Ambulatory Visit: Payer: Self-pay

## 2021-11-02 ENCOUNTER — Ambulatory Visit: Payer: Medicare PPO

## 2021-11-02 DIAGNOSIS — R2689 Other abnormalities of gait and mobility: Secondary | ICD-10-CM | POA: Diagnosis not present

## 2021-11-02 NOTE — Patient Instructions (Signed)
Added postural exercises to improve upright posture and reduce fwd fall risk.   Access Code: [email or web address] URL: [email or web address] Date: 11/02/2021 Prepared by: Candyce Churn  Exercises Standing Single Leg Stance with Counter Support - 1 x daily - 7 x weekly - 1 sets - 2 reps - 30 hold Standing Tandem Balance with Counter Support - 1 x daily - 7 x weekly - 1 sets - 2 reps - 30 hold Standing March with Counter Support - 1 x daily - 7 x weekly - 1 sets - 20 reps Seated Heel Toe Raises - 1 x daily - 7 x weekly - 1 sets - 20 reps Seated Hamstring Stretch - 1 x daily - 7 x weekly - 1 sets - 2 reps - 20 hold Standing Hamstring Stretch on Chair - 2 x daily - 7 x weekly - 1 sets - 3 reps - 30 sec hold Supine Figure 4 Piriformis Stretch - 2 x daily - 7 x weekly - 1 sets - 3 reps - 30 sec hold Sidelying Hip Abduction - 2 x daily - 7 x weekly - 2 sets - 10 reps Shoulder extension with resistance - Neutral - 2 x daily - 7 x weekly - 2 sets - 10 reps Standing Shoulder Row with Anchored Resistance - 2 x daily - 7 x weekly - 2 sets - 10 reps Shoulder External Rotation and Scapular Retraction with Resistance - 2 x daily - 7 x weekly - 2 sets - 10 reps Standing Shoulder Horizontal Abduction with Resistance - 2 x daily - 7 x weekly - 2 sets - 10 reps

## 2021-11-02 NOTE — Therapy (Signed)
[facility] [place] [address] [place], [place], [address] Phone: [phone]   Fax:  [phone]  Physical Therapy Treatment  Patient Details  Name: Jesse Hampton MRN: [id number] Date of Birth: 10/17/1930 Referring Provider (PT): Farris HasMorrow, Aaron, MD   Encounter Date: 11/02/2021   PT End of Session - 11/02/21 1446     Visit Number 8    Date for PT Re-Evaluation 11/12/21    Authorization Type [employer]    Authorization Time Period 12 visits between 09/08/21-10/20/21 (Requested 7 more visits 10-21-21 through 11-12-21)    Authorization - Visit Number 8    Authorization - Number of Visits 12    PT Start Time 1402    PT Stop Time 1445    PT Time Calculation (min) 43 min    Activity Tolerance Patient tolerated treatment well    Behavior [facility] Therapy [facility] for tasks assessed/performed             Past Medical History:  Diagnosis Date   Anxiety    Arthritis    hands,   Chronic kidney disease    kidney stones   Depression     Past Surgical History:  Procedure Laterality Date   EYE SURGERY  2006   cartaract bil.    There were no vitals filed for this visit.   Subjective Assessment - 11/02/21 1443     Subjective Patient states his neck has been stiff and this is causing him to slouch fwd.  We discuss importance of upright posture and its relation to balance.    Pertinent History PMH: HOH, prostate cancer w/ radiation 11 years ago    How long can you walk comfortably? walks 1.5 miles daily    Patient Stated Goals avoid AD, work on vestibular system if it is needed, balance    Currently in Pain? Yes    Pain Score 4     Pain Location Neck    Pain Orientation Medial    Pain Descriptors / Indicators Aching;Discomfort    Pain Type Acute pain    Pain Onset In the past 7 days    Pain Frequency Intermittent                               [facility] Adult PT Treatment/Exercise - 11/02/21 0001        High Level Balance   High Level Balance Activities Other (comment)   standing on balance pad: shoulder rows, shoulder extension, bilateral shoulder ER and horizontal abduction to challenge balance while working on postural strength to improve upright posture and reduce fall risk.     Lumbar Exercises: Aerobic   Nustep L5 x 5' PT present to discuss initial HEP      Knee/Hip Exercises: Stretches   Active Hamstring Stretch Both;3 reps;30 seconds    Active Hamstring Stretch Limitations standing at steps    LobbyistQuad Stretch Both;3 reps;30 seconds    Quad Stretch Limitations standing with foot in chair behind him      Knee/Hip Exercises: Seated   Sit to [employer] 2 sets;10 reps;without UE support                       PT Short Term Goals - 10/19/21 1509       PT SHORT TERM GOAL #1   Title Pt ind with initial HEP    Time 3  Period Weeks    Status Achieved    Target Date 09/29/21      PT SHORT TERM GOAL #2   Title Pt able to balance in SLS on Rt/Lt LEs with intermittent UE support x 30 sec    Time 4    Period Weeks    Status Achieved    Target Date 10/06/21      PT SHORT TERM GOAL #3   Title Pt able to perform tandem gait fwd and bwd along counter with supervision and intermittent UE support x 3 passes without signif LOB.    Time 4    Period Weeks    Status On-going    Target Date 10/06/21               PT Long Term Goals - 10/19/21 1510       PT LONG TERM GOAL #1   Title Pt will achieve trunk and hip mobility to [facility] to reduce resistance for functional tasks.    Time 6    Period Weeks    Status On-going    Target Date 11/12/21      PT LONG TERM GOAL #2   Title Pt will achieve 5x sit to stand in 12 sec and TUG in 13 sec to demo reduced fall risk.    Time 6    Period Weeks    Status On-going    Target Date 11/12/21      PT LONG TERM GOAL #3   Title Pt will be able to demo multi-directional stepping x 5 cycles each on Rt and Lt stabilizing LEs  without LOB    Time 6    Period Weeks    Status On-going    Target Date 11/12/21      PT LONG TERM GOAL #4   Title Pt will improve DGI score to at least 21/24 to demo improved dynamic balance with gait challenges    Time 8    Period Weeks    Status On-going    Target Date 11/12/21      PT LONG TERM GOAL #5   Title Pt will score at least 85% on ABC scale to demo improved balance confidence with varied challenges    Baseline 77.5%    Time 6    Period Weeks    Status On-going    Target Date 11/12/21                   Plan - 11/02/21 1447     Clinical Impression Statement Jesse Hampton is progressing appropriately and seeks out new exercises to improve his overall flexibility and strength.  He is very compliant and motivated.  He would benefit from continued skilled PT to address flexibility issues that are affecting his stability.    Personal Factors and Comorbidities Age;Comorbidity 1    Comorbidities Hx of prostate cancer/radiation 11 years ago    Examination-Activity Limitations Squat;Bend;Locomotion Level    Examination-Participation Restrictions Psychiatric nurse;Shop;Yard Work    Stability/Clinical Decision Making Stable/Uncomplicated    Clinical Decision Making Low    Rehab Potential Excellent    PT Frequency 2x / week    PT Duration 4 weeks    PT Treatment/Interventions ADLs/Self Care Home Management;Moist Heat;Gait training;Stair training;Functional mobility training;Therapeutic activities;Therapeutic exercise;Balance training;Neuromuscular re-education;Patient/family education;Manual techniques;Passive range of motion;Spinal Manipulations;Joint Manipulations    PT Next Visit Plan Progress LE flexibility and balance tasks.  Started some trunk flexibility stretches last visit.    PT Home Exercise Plan  Access Code: [email or web address]    Consulted and Agree with Plan of Care Patient             Patient will benefit from skilled therapeutic intervention  in order to improve the following deficits and impairments:  Decreased coordination, Decreased range of motion, Postural dysfunction, Decreased mobility, Decreased balance, Impaired flexibility  Visit Diagnosis: Other abnormalities of gait and mobility     Problem List Patient Active Problem List   Diagnosis Date Noted   Family history of Alzheimer's disease 09/20/2016   Meniere disease 04/15/2016   Lumbar radiculopathy 07/07/2015   Routine general medical examination at a health care facility 04/02/2015   Thyroid nodule 04/02/2015   Chronic pruritus 12/25/2014   Benign prostatic hyperplasia 11/16/2014   Hyperlipidemia 11/16/2014   Major depression in remission (H[facility]) 11/16/2014   Allergic rhinitis 11/16/2014    Victorino Dike B. Arriona Prest, PT 01/10/235:26 PM   [facility] [place] [address] [place], [place], [address] Phone: [phone]   Fax:  [phone]  Name: Jesse Hampton MRN: [id number] Date of Birth: February 04, 1930

## 2021-11-04 ENCOUNTER — Ambulatory Visit: Payer: Medicare PPO

## 2021-11-04 ENCOUNTER — Other Ambulatory Visit: Payer: Self-pay

## 2021-11-04 DIAGNOSIS — R2689 Other abnormalities of gait and mobility: Secondary | ICD-10-CM

## 2021-11-04 NOTE — Therapy (Signed)
[facility] @ [place], [place], [address] Phone: [phone]   Fax:  [phone]  Physical Therapy Treatment  Patient Details  Name: Jesse Hampton MRN: [id number] Date of Birth: 10/15/1930 Referring Provider (PT): London Pepper, MD   Encounter Date: 11/04/2021   PT End of Session - 11/04/21 1057     Visit Number 9    Date for PT Re-Evaluation 11/12/21    Authorization Type [employer]    Authorization Time Period 12 visits between 09/08/21-10/20/21 (Requested 7 more visits 10-21-21 through 11-12-21)    Authorization - Visit Number 9    Authorization - Number of Visits 12    PT Start Time [id number]    PT Stop Time 1059    PT Time Calculation (min) 44 min    Activity Tolerance Patient tolerated treatment well    Behavior During Therapy [facility] for tasks assessed/performed             Past Medical History:  Diagnosis Date   Anxiety    Arthritis    hands,   Chronic kidney disease    kidney stones   Depression     Past Surgical History:  Procedure Laterality Date   EYE SURGERY  2006   cartaract bil.    There were no vitals filed for this visit.   Subjective Assessment - 11/04/21 1038     Subjective Patient states he is doing well but has some questions about some of his stretches and the last set of exercises we added.    Pertinent History PMH: HOH, prostate cancer w/ radiation 11 years ago    How long can you walk comfortably? walks 1.5 miles daily    Patient Stated Goals avoid AD, work on vestibular system if it is needed, balance    Pain Onset In the past 7 days                               [facility] Adult PT Treatment/Exercise - 11/04/21 0001       Exercises   Exercises Neck      Neck Exercises: Theraband   Shoulder Extension Red;20 reps    Rows 20 reps;Red    Shoulder External Rotation 20 reps;Red    Horizontal ABduction 20 reps;Red      Lumbar Exercises:  Stretches   Active Hamstring Stretch Right;Left;3 reps;30 seconds    Active Hamstring Stretch Limitations standing at steps    Quad Stretch Right;Left;3 reps;30 seconds      Lumbar Exercises: Aerobic   Nustep L5 x 5' PT present to discuss initial HEP                     PT Education - 11/04/21 1052     Education Details Patient has a lot of questions each visit about proper technique with each exercise.  We spent majority of the session reviewing today but patient is very compliant and diligent with his HEP              PT Short Term Goals - 10/19/21 1509       PT SHORT TERM GOAL #1   Title Pt ind with initial HEP    Time 3    Period Weeks    Status Achieved    Target Date 09/29/21      PT SHORT TERM GOAL #2   Title Pt able to  balance in SLS on Rt/Lt LEs with intermittent UE support x 30 sec    Time 4    Period Weeks    Status Achieved    Target Date 10/06/21      PT SHORT TERM GOAL #3   Title Pt able to perform tandem gait fwd and bwd along counter with supervision and intermittent UE support x 3 passes without signif LOB.    Time 4    Period Weeks    Status On-going    Target Date 10/06/21               PT Long Term Goals - 10/19/21 1510       PT LONG TERM GOAL #1   Title Pt will achieve trunk and hip mobility to [facility] to reduce resistance for functional tasks.    Time 6    Period Weeks    Status On-going    Target Date 11/12/21      PT LONG TERM GOAL #2   Title Pt will achieve 5x sit to stand in 12 sec and TUG in 13 sec to demo reduced fall risk.    Time 6    Period Weeks    Status On-going    Target Date 11/12/21      PT LONG TERM GOAL #3   Title Pt will be able to demo multi-directional stepping x 5 cycles each on Rt and Lt stabilizing LEs without LOB    Time 6    Period Weeks    Status On-going    Target Date 11/12/21      PT LONG TERM GOAL #4   Title Pt will improve DGI score to at least 21/24 to demo improved dynamic balance  with gait challenges    Time 8    Period Weeks    Status On-going    Target Date 11/12/21      PT LONG TERM GOAL #5   Title Pt will score at least 85% on ABC scale to demo improved balance confidence with varied challenges    Baseline 77.5%    Time 6    Period Weeks    Status On-going    Target Date 11/12/21                   Plan - 11/04/21 1059     Clinical Impression Statement Jesse Hampton was able to complete all exercises properly with moderate verbal cues.  He has handouts of all exercises and does appear to be diligent with his HEP but becomes unsure of technique and wants repeated reviews.  He has resumed his walking program and is up to 1 mile.    Personal Factors and Comorbidities Age;Comorbidity 1    Comorbidities Hx of prostate cancer/radiation 11 years ago    Examination-Activity Limitations Squat;Bend;Locomotion Level    Examination-Participation Restrictions Estate agent;Shop;Yard Work    Stability/Clinical Decision Making Stable/Uncomplicated    Clinical Decision Making Low    PT Frequency 2x / week    PT Duration 4 weeks    PT Treatment/Interventions ADLs/Self Care Home Management;Moist Heat;Gait training;Stair training;Functional mobility training;Therapeutic activities;Therapeutic exercise;Balance training;Neuromuscular re-education;Patient/family education;Manual techniques;Passive range of motion;Spinal Manipulations;Joint Manipulations    PT Next Visit Plan Progress LE flexibility and balance tasks.  Started some trunk flexibility stretches last visit.    PT Home Exercise Plan Access Code: [email or web address]    Consulted and Agree with Plan of Care Patient  Patient will benefit from skilled therapeutic intervention in order to improve the following deficits and impairments:  Decreased coordination, Decreased range of motion, Postural dysfunction, Decreased mobility, Decreased balance, Impaired flexibility  Visit  Diagnosis: Other abnormalities of gait and mobility     Problem List Patient Active Problem List   Diagnosis Date Noted   Family history of Alzheimer's disease 09/20/2016   Meniere disease 04/15/2016   Lumbar radiculopathy 07/07/2015   Routine general medical examination at a health care facility 04/02/2015   Thyroid nodule 04/02/2015   Chronic pruritus 12/25/2014   Benign prostatic hyperplasia 11/16/2014   Hyperlipidemia 11/16/2014   Major depression in remission ([place]) 11/16/2014   Allergic rhinitis 11/16/2014    Anderson Malta B. Jillian Pianka, PT 11/04/2309:02 AM   [facility] @ [place], [place], [address] Phone: [phone]   Fax:  [phone]  Name: Jesse Hampton MRN: [id number] Date of Birth: [birth year]

## 2021-11-04 NOTE — Patient Instructions (Signed)
Reviewed quad and hamstring stretches along with new postural exercises.

## 2021-11-11 ENCOUNTER — Other Ambulatory Visit: Payer: Self-pay

## 2021-11-11 ENCOUNTER — Ambulatory Visit: Payer: Medicare PPO

## 2021-11-11 DIAGNOSIS — R2689 Other abnormalities of gait and mobility: Secondary | ICD-10-CM | POA: Diagnosis not present

## 2021-11-11 NOTE — Patient Instructions (Signed)
Added shoulder specific exercises to assist with rounded shoulders.  Instructed patient in these today but he will need handout.

## 2021-11-11 NOTE — Therapy (Addendum)
[facility] @ [place], [place], [address] Phone: [phone]   Fax:  [phone]  Physical Therapy Recertification  Patient Details  Name: Jesse Hampton MRN: [id number] Date of Birth: 08/13/1930 Referring Provider (PT): London Pepper, MD   Encounter Date: 11/11/2021   PT End of Session - 11/11/21 1051     Visit Number 10    Date for PT Re-Evaluation 12/09/21    Authorization Type [employer]    Authorization Time Period Requested 4 more visits between 11-13-21 and 12-10-21    Authorization - Number of Visits 12    PT Start Time 1015    PT Stop Time 1100    PT Time Calculation (min) 45 min    Activity Tolerance Patient tolerated treatment well    Behavior During Therapy [facility] for tasks assessed/performed             Past Medical History:  Diagnosis Date   Anxiety    Arthritis    hands,   Chronic kidney disease    kidney stones   Depression     Past Surgical History:  Procedure Laterality Date   EYE SURGERY  2006   cartaract bil.    There were no vitals filed for this visit.   Subjective Assessment - 11/11/21 1242     Subjective Patient states "well not doing so well".  I just keep getting these aches and pains that keep me from doing what I want to do.  He also goes on to say that he admits that a lot of his frustrations are that his recreational and exercise options are so limited because all of his friends that he used to play golf with or walk with, are no longer able to do this.  He previously had a group of men that he exercised with "from [place]" but they have all passed away.  He occasionally goes to walk while one of his grandsons goes for a run and he enjoys this but really enjoys the social aspect of exercising.  He also reports some right shoulder pain that began in the past few days.    Pertinent History PMH: HOH, prostate cancer w/ radiation 11 years ago    How long can you  walk comfortably? walks 1.5 miles daily    Patient Stated Goals avoid AD, work on vestibular system if it is needed, balance    Currently in Pain? Yes    Pain Score 4     Pain Location Shoulder    Pain Orientation Right    Pain Descriptors / Indicators Aching    Pain Type Acute pain    Pain Onset In the past 7 days    Pain Frequency Intermittent                [facility] PT Assessment - 11/11/21 0001       Assessment   Medical Diagnosis R26.89 (ICD-10-CM) - Other abnormalities of gait and mobility    Referring Provider (PT) London Pepper, MD      Precautions   Precautions Fall      Observation/Other Assessments   Focus on Therapeutic Outcomes (FOTO)  ABC balance scale: 77.5% confidence   Need to re-do FOTO: will do next visit     Posture/Postural Control   Posture/Postural Control Postural limitations    Postural Limitations Flexed trunk;Rounded Shoulders;Forward head;Increased thoracic kyphosis      AROM   Overall AROM Comments trunk ROM grossly  limited 50%, bil hips grossly limited 50%      Strength   Overall Strength Comments bil LEs grossly 4+/5      Flexibility   Soft Tissue Assessment /Muscle Length yes    Hamstrings limited 40% bil    Piriformis limited 20% bil      Ambulation/Gait   Gait Pattern Poor foot clearance - left;Poor foot clearance - right      Standardized Balance Assessment   Five times sit to stand comments  12 sec      Timed Up and Go Test   TUG Normal TUG    Normal TUG (seconds) 12                           [facility] Adult PT Treatment/Exercise - 11/11/21 0001       Lumbar Exercises: Aerobic   Nustep L5 x 5' PT present to discuss initial HEP      Knee/Hip Exercises: Stretches   Active Hamstring Stretch Both;3 reps;30 seconds    Active Hamstring Stretch Limitations standing at steps    ITB Stretch Both;3 reps;30 seconds    Piriformis Stretch Both;3 reps;30 seconds                     PT Education -  11/11/21 1301     Education Details Spent as much as 15 min of session discussing patients lengthy questions which we have touched on frequently.  It appears that patient really does not have friends to exercise, play golf or socialize with because most of his friends have passed away or they are too debilitated to join him.  We discussed finding new groups via group exercise programs or pool program.    Person(s) Educated Patient    Methods Explanation    Comprehension Verbalized understanding              PT Short Term Goals - 11/11/21 1317       PT SHORT TERM GOAL #1   Title Pt ind with initial HEP    Time 3    Period Weeks    Status Achieved    Target Date 09/29/21      PT SHORT TERM GOAL #2   Title Pt able to balance in SLS on Rt/Lt LEs with intermittent UE support x 30 sec    Time 4    Period Weeks    Status Achieved    Target Date 10/06/21      PT SHORT TERM GOAL #3   Title Pt able to perform tandem gait fwd and bwd along counter with supervision and intermittent UE support x 3 passes without signif LOB.    Time 4    Period Weeks    Status On-going               PT Long Term Goals - 11/11/21 1317       PT LONG TERM GOAL #1   Title Pt will achieve trunk and hip mobility to [facility] to reduce resistance for functional tasks.    Time 6    Period Weeks    Status On-going    Target Date 12/09/21      PT LONG TERM GOAL #2   Title Pt will achieve 5x sit to stand in 12 sec and TUG in 13 sec to demo reduced fall risk.    Time 6    Period Weeks    Status Achieved  Target Date 11/12/21      PT LONG TERM GOAL #3   Title Pt will be able to demo multi-directional stepping x 5 cycles each on Rt and Lt stabilizing LEs without LOB    Time 6    Status On-going    Target Date 12/10/21      PT LONG TERM GOAL #4   Title Pt will improve DGI score to at least 21/24 to demo improved dynamic balance with gait challenges    Time 8    Period Weeks    Status On-going     Target Date 12/10/21      PT LONG TERM GOAL #5   Title Pt will score at least 85% on ABC scale to demo improved balance confidence with varied challenges    Baseline 77.5%    Time 4    Period Weeks    Status On-going    Target Date 12/09/21                   Plan - 11/11/21 1307     Clinical Impression Statement Patient arrived with some shoulder pain today.  He also expresses frustration that his right hip has become tight again.  Patient spent as much as 15 min discussing his frustrations over his age and having no one to exercise with or play golf or any other active type of activity.  He does have a fair amount of flexibility issues but he is able to do the stretches and exercises prescribed with ease. He is a bit aggressive with his stretches at times and needs frequent review on technique.  He had some new hip pain and postural shoulder pain that we added some exercises for and reviewed his HEP but patient seems to have a new place of concern each time he comes in and may be getting overwhelmed with lengthy HEP.  Compliance with HEP is questionable as he does not seem to recall technique and needs frequent review.  But he also, Jeneen Montgomery, has a very lengthy program.  He would benefit from aquatic therapy but he is unsure if he would like to do that.  We will request 4 more visits to begin narrowing down all exercises and assist with developing a routine program for patient to be able to continue on his own.    Personal Factors and Comorbidities Age;Comorbidity 1    Comorbidities Hx of prostate cancer/radiation 11 years ago    Examination-Activity Limitations Squat;Bend;Locomotion Level    Examination-Participation Restrictions Estate agent;Shop;Yard Work    Stability/Clinical Decision Making Stable/Uncomplicated    Clinical Decision Making Low    Rehab Potential Excellent    PT Frequency 1x / week    PT Treatment/Interventions ADLs/Self Care Home  Management;Moist Heat;Gait training;Stair training;Functional mobility training;Therapeutic activities;Therapeutic exercise;Balance training;Neuromuscular re-education;Patient/family education;Manual techniques;Passive range of motion;Spinal Manipulations;Joint Manipulations    PT Next Visit Plan Review all of HEP and simplify to avoid patient being overwhelmed.    PT Home Exercise Plan Access Code: [email or web address]    Consulted and Agree with Plan of Care Patient            Patient called on 11/17/21 to request DC PHYSICAL THERAPY DISCHARGE SUMMARY  Visits from Start of Care: 10  Current functional level related to goals / functional outcomes: See above   Remaining deficits: See above   Education / Equipment: See above   Patient agrees to discharge. Patient goals were met. Patient is being discharged due to being pleased with  the current functional level.  Patient will benefit from skilled therapeutic intervention in order to improve the following deficits and impairments:  Decreased coordination, Decreased range of motion, Postural dysfunction, Decreased mobility, Decreased balance, Impaired flexibility  Visit Diagnosis: Other abnormalities of gait and mobility - Plan: PT plan of care cert/re-cert     Problem List Patient Active Problem List   Diagnosis Date Noted   Family history of Alzheimer's disease 09/20/2016   Meniere disease 04/15/2016   Lumbar radiculopathy 07/07/2015   Routine general medical examination at a health care facility 04/02/2015   Thyroid nodule 04/02/2015   Chronic pruritus 12/25/2014   Benign prostatic hyperplasia 11/16/2014   Hyperlipidemia 11/16/2014   Major depression in remission ([place]) 11/16/2014   Allergic rhinitis 11/16/2014    Anderson Malta B. Analiz Tvedt, PT 01/26/232:05 PM   [facility] @ [place], [place], [address] Phone: [phone]   Fax:  [phone]  Name: Kohen Reither MRN: [id number] Date of Birth: 02-02-1930

## 2021-11-15 DIAGNOSIS — M13841 Other specified arthritis, right hand: Secondary | ICD-10-CM | POA: Diagnosis not present

## 2021-11-15 DIAGNOSIS — M13849 Other specified arthritis, unspecified hand: Secondary | ICD-10-CM | POA: Diagnosis not present

## 2021-11-15 DIAGNOSIS — R52 Pain, unspecified: Secondary | ICD-10-CM | POA: Diagnosis not present

## 2021-11-15 DIAGNOSIS — M79641 Pain in right hand: Secondary | ICD-10-CM | POA: Diagnosis not present

## 2021-11-18 ENCOUNTER — Ambulatory Visit: Payer: Medicare PPO

## 2021-12-13 DIAGNOSIS — S61412A Laceration without foreign body of left hand, initial encounter: Secondary | ICD-10-CM | POA: Diagnosis not present

## 2021-12-13 DIAGNOSIS — W540XXA Bitten by dog, initial encounter: Secondary | ICD-10-CM | POA: Diagnosis not present

## 2022-02-18 DIAGNOSIS — Z8546 Personal history of malignant neoplasm of prostate: Secondary | ICD-10-CM | POA: Diagnosis not present

## 2022-02-25 DIAGNOSIS — N401 Enlarged prostate with lower urinary tract symptoms: Secondary | ICD-10-CM | POA: Diagnosis not present

## 2022-02-25 DIAGNOSIS — R3914 Feeling of incomplete bladder emptying: Secondary | ICD-10-CM | POA: Diagnosis not present

## 2022-05-02 DIAGNOSIS — F325 Major depressive disorder, single episode, in full remission: Secondary | ICD-10-CM | POA: Diagnosis not present

## 2022-05-02 DIAGNOSIS — L509 Urticaria, unspecified: Secondary | ICD-10-CM | POA: Diagnosis not present

## 2022-05-02 DIAGNOSIS — L309 Dermatitis, unspecified: Secondary | ICD-10-CM | POA: Diagnosis not present

## 2022-05-02 DIAGNOSIS — M2550 Pain in unspecified joint: Secondary | ICD-10-CM | POA: Diagnosis not present

## 2022-06-02 DIAGNOSIS — M19049 Primary osteoarthritis, unspecified hand: Secondary | ICD-10-CM | POA: Diagnosis not present

## 2022-06-02 DIAGNOSIS — M542 Cervicalgia: Secondary | ICD-10-CM | POA: Diagnosis not present

## 2022-06-02 DIAGNOSIS — M79641 Pain in right hand: Secondary | ICD-10-CM | POA: Diagnosis not present

## 2022-08-15 DIAGNOSIS — E041 Nontoxic single thyroid nodule: Secondary | ICD-10-CM | POA: Diagnosis not present

## 2022-08-15 DIAGNOSIS — Z0000 Encounter for general adult medical examination without abnormal findings: Secondary | ICD-10-CM | POA: Diagnosis not present

## 2022-08-15 DIAGNOSIS — R7309 Other abnormal glucose: Secondary | ICD-10-CM | POA: Diagnosis not present

## 2022-08-15 DIAGNOSIS — E785 Hyperlipidemia, unspecified: Secondary | ICD-10-CM | POA: Diagnosis not present

## 2022-08-15 DIAGNOSIS — D649 Anemia, unspecified: Secondary | ICD-10-CM | POA: Diagnosis not present

## 2022-08-15 DIAGNOSIS — F325 Major depressive disorder, single episode, in full remission: Secondary | ICD-10-CM | POA: Diagnosis not present

## 2022-08-15 DIAGNOSIS — Z8546 Personal history of malignant neoplasm of prostate: Secondary | ICD-10-CM | POA: Diagnosis not present

## 2022-10-26 ENCOUNTER — Emergency Department (HOSPITAL_BASED_OUTPATIENT_CLINIC_OR_DEPARTMENT_OTHER): Payer: Medicare PPO | Admitting: Radiology

## 2022-10-26 ENCOUNTER — Other Ambulatory Visit: Payer: Self-pay

## 2022-10-26 ENCOUNTER — Emergency Department (HOSPITAL_BASED_OUTPATIENT_CLINIC_OR_DEPARTMENT_OTHER)
Admission: EM | Admit: 2022-10-26 | Discharge: 2022-10-26 | Discharge status: Against Medical Advice | Payer: Medicare PPO | Attending: Emergency Medicine | Admitting: Emergency Medicine

## 2022-10-26 ENCOUNTER — Encounter (HOSPITAL_BASED_OUTPATIENT_CLINIC_OR_DEPARTMENT_OTHER): Payer: Self-pay

## 2022-10-26 DIAGNOSIS — M79674 Pain in right toe(s): Secondary | ICD-10-CM | POA: Insufficient documentation

## 2022-10-26 DIAGNOSIS — Z5321 Procedure and treatment not carried out due to patient leaving prior to being seen by health care provider: Secondary | ICD-10-CM | POA: Diagnosis not present

## 2022-10-26 DIAGNOSIS — R0781 Pleurodynia: Secondary | ICD-10-CM | POA: Diagnosis not present

## 2022-10-26 DIAGNOSIS — W5589XA Other contact with other mammals, initial encounter: Secondary | ICD-10-CM | POA: Insufficient documentation

## 2022-10-26 DIAGNOSIS — S60221A Contusion of right hand, initial encounter: Secondary | ICD-10-CM | POA: Diagnosis not present

## 2022-10-26 NOTE — ED Triage Notes (Signed)
Patient here POV from Home.  Endorses Fall at 1000 today when he was walking his dog and pulled away causing him to fall.  Fell onto Right Lateral Torso and Right Hand. Swelling and Pain to Right Fourth Digit.   No Head Injury. No LOC. No Anticoagulants.   NAD Noted during Triage. A&Ox4. GCS 15. Ambulatory.

## 2022-12-12 DIAGNOSIS — E785 Hyperlipidemia, unspecified: Secondary | ICD-10-CM | POA: Diagnosis not present

## 2022-12-12 DIAGNOSIS — M79644 Pain in right finger(s): Secondary | ICD-10-CM | POA: Diagnosis not present

## 2022-12-12 DIAGNOSIS — Z125 Encounter for screening for malignant neoplasm of prostate: Secondary | ICD-10-CM | POA: Diagnosis not present

## 2022-12-12 DIAGNOSIS — L509 Urticaria, unspecified: Secondary | ICD-10-CM | POA: Diagnosis not present

## 2022-12-12 DIAGNOSIS — E559 Vitamin D deficiency, unspecified: Secondary | ICD-10-CM | POA: Diagnosis not present

## 2022-12-28 DIAGNOSIS — L509 Urticaria, unspecified: Secondary | ICD-10-CM | POA: Diagnosis not present

## 2022-12-28 DIAGNOSIS — Z125 Encounter for screening for malignant neoplasm of prostate: Secondary | ICD-10-CM | POA: Diagnosis not present

## 2022-12-28 DIAGNOSIS — E559 Vitamin D deficiency, unspecified: Secondary | ICD-10-CM | POA: Diagnosis not present

## 2022-12-28 DIAGNOSIS — E785 Hyperlipidemia, unspecified: Secondary | ICD-10-CM | POA: Diagnosis not present

## 2023-01-25 DIAGNOSIS — E785 Hyperlipidemia, unspecified: Secondary | ICD-10-CM | POA: Diagnosis not present

## 2023-01-25 DIAGNOSIS — E559 Vitamin D deficiency, unspecified: Secondary | ICD-10-CM | POA: Diagnosis not present

## 2023-01-25 DIAGNOSIS — D649 Anemia, unspecified: Secondary | ICD-10-CM | POA: Diagnosis not present

## 2023-01-25 DIAGNOSIS — L509 Urticaria, unspecified: Secondary | ICD-10-CM | POA: Diagnosis not present

## 2023-01-25 DIAGNOSIS — Z0001 Encounter for general adult medical examination with abnormal findings: Secondary | ICD-10-CM | POA: Diagnosis not present

## 2023-01-25 DIAGNOSIS — R2689 Other abnormalities of gait and mobility: Secondary | ICD-10-CM | POA: Diagnosis not present

## 2023-01-25 DIAGNOSIS — R42 Dizziness and giddiness: Secondary | ICD-10-CM | POA: Diagnosis not present

## 2023-02-14 ENCOUNTER — Ambulatory Visit: Payer: Medicare PPO | Admitting: Physical Therapy

## 2023-02-16 ENCOUNTER — Encounter: Payer: Self-pay | Admitting: Physical Therapy

## 2023-02-16 ENCOUNTER — Other Ambulatory Visit: Payer: Self-pay

## 2023-02-16 ENCOUNTER — Ambulatory Visit: Payer: Medicare PPO | Attending: Internal Medicine | Admitting: Physical Therapy

## 2023-02-16 DIAGNOSIS — R2681 Unsteadiness on feet: Secondary | ICD-10-CM | POA: Diagnosis not present

## 2023-02-16 DIAGNOSIS — R2689 Other abnormalities of gait and mobility: Secondary | ICD-10-CM | POA: Insufficient documentation

## 2023-02-16 DIAGNOSIS — M6281 Muscle weakness (generalized): Secondary | ICD-10-CM | POA: Diagnosis not present

## 2023-02-16 NOTE — Therapy (Signed)
OUTPATIENT PHYSICAL THERAPY NEURO EVALUATION   Patient Name: Jesse Hampton MRN: [id number] DOB:10/27/1929, 89 y.o., male Today's Date: 02/16/2023   PCP: Myrlene Broker, MD REFERRING PROVIDER: Andi Devon, MD   END OF SESSION:  PT End of Session - 02/16/23 1020     Visit Number 1    Number of Visits 13    Date for PT Re-Evaluation 03/31/23    Authorization Type [employer]-auth submitted at eval    Progress Note Due on Visit 10    PT Start Time 1020    PT Stop Time 1105    PT Time Calculation (min) 45 min    Equipment Utilized During Treatment Gait belt    Activity Tolerance Patient tolerated treatment well    Behavior During Therapy WFL for tasks assessed/performed             Past Medical History:  Diagnosis Date   Anxiety    Arthritis    hands,   Chronic kidney disease    kidney stones   Depression    Past Surgical History:  Procedure Laterality Date   EYE SURGERY  2006   cartaract bil.   Patient Active Problem List   Diagnosis Date Noted   Family history of Alzheimer's disease 09/20/2016   Meniere disease 04/15/2016   Lumbar radiculopathy 07/07/2015   Routine general medical examination at a health care facility 04/02/2015   Thyroid nodule 04/02/2015   Chronic pruritus 12/25/2014   Benign prostatic hyperplasia 11/16/2014   Hyperlipidemia 11/16/2014   Major depression in remission 11/16/2014   Allergic rhinitis 11/16/2014    ONSET DATE: 01/27/2023  REFERRING DIAG: R26.89 (ICD-10-CM) - Other abnormalities of gait and mobility   THERAPY DIAG:  Other abnormalities of gait and mobility  Unsteadiness on feet  Muscle weakness (generalized)  Rationale for Evaluation and Treatment: Rehabilitation  SUBJECTIVE:                                                                                                                                                                                             SUBJECTIVE STATEMENT: Not doing  too good.  Having a lot of vertigo.  Had Covid in early March.  Since then, I've had a hard time getting back to where I was.  Have had several serious falls, but didn't hit head.  Getting more tired with walking the dog and that concerns me.  Has trouble with vertigo-mostly when I stand up can't get balance when I walk. Feel off-balance with walking. Pt accompanied by: self  PERTINENT HISTORY: See PMH above, hx of L ankle fracture x 2 in the  distant past  PAIN:  Are you having pain? No  PRECAUTIONS: Fall  WEIGHT BEARING RESTRICTIONS: No  FALLS: Has patient fallen in last 6 months? Yes. Number of falls 4 Falls occurred in the yard, driveway, had difficulty getting up from the ground.  Also fell in the house with a quick turn.     LIVING ENVIRONMENT: Lives with: lives with their spouse Lives in: House/apartment Stairs: Yes: External: 2 steps; on right going up and on left going up Has following equipment at home: None  PLOF: Independent and Leisure: walks dog 1.5 miles per day  PATIENT GOALS: To help with vertigo and to help with walking/balance.  OBJECTIVE:  *symptom description:  When asked about "vertigo" symptoms, pt reports no spinning sensations or motion sensitivity.  His symptom report is more unsteadiness and off balance feeling.  He does report light-headedness at times. * DIAGNOSTIC FINDINGS: NA for this episode  COGNITION: Overall cognitive status: Within functional limits for tasks assessed   SENSATION: WFL  POSTURE: rounded shoulders, forward head, and posterior pelvic tilt  LOWER EXTREMITY ROM:   Acitve ROM WFL  LOWER EXTREMITY MMT:  Grossly tested at least 4+/5 BLEs  TRANSFERS: Assistive device utilized: None  Sit to stand: Modified independence Stand to sit: Modified independence   GAIT: Gait pattern: step through pattern and decreased stride length Distance walked: 50 ft Assistive device utilized: None Level of assistance: SBA  FUNCTIONAL TESTS:   5 times sit to stand: 17 sec arms crossed at chest Timed up and go (TUG): 17.88 sec Functional gait assessment: NA 10 M:  10.94 sec (3 ft/sec) DGI:  15/24 (Scores <19/24 indicate increased fall risk) SLS:  unable to stand either leg without UE support  M-CTSIB  Condition 1: Firm Surface, EO 30 Sec, Normal Sway  Condition 2: Firm Surface, EC 30 Sec, Mild Sway  Condition 3: Foam Surface, EO 30 Sec, Mild Sway  Condition 4: Foam Surface, EC 30 Sec, Moderate Sway       [facility] PT Assessment - 02/16/23 0001       Standardized Balance Assessment   Standardized Balance Assessment Dynamic Gait Index      Dynamic Gait Index   Level Surface Mild Impairment    Change in Gait Speed Mild Impairment    Gait with Horizontal Head Turns Moderate Impairment    Gait with Vertical Head Turns Moderate Impairment    Gait and Pivot Turn Normal    Step Over Obstacle Mild Impairment    Step Around Obstacles Mild Impairment    Steps Mild Impairment    Total Score 15    DGI comment: Scores <19/24 indicate increased fall risk             TODAY'S TREATMENT:                                                                                                                              DATE: 02/16/2023  PATIENT EDUCATION: Education details: Eval results, POC, explained balance in relation to vestibular system; initiated HEP Person educated: Patient Education method: Explanation Education comprehension: verbalized understanding  HOME EXERCISE PROGRAM: Access Code: [email or web address] URL: [email or web address] Date: 02/16/2023 Prepared by: [facility] - Outpatient  Rehab - [facility]  Exercises - Romberg Stance  - 1-2 x daily - 7 x weekly - 2 sets - 5 reps - Romberg Stance with Eyes Closed  - 1-2 x daily - 7 x weekly - 2 sets - 5 reps - Single Leg Stance with Support  - 1 x daily - 7 x weekly - 1 sets - 3 reps - 10 sec hold  GOALS: Goals reviewed with patient? Yes  SHORT TERM GOALS:  Target date: 03/17/2023  Pt will be independent with HEP for improved balance, strength, gait. Baseline: Goal status: INITIAL  2.  Pt will improve 5x sit<>stand to less than or equal to 15 sec to demonstrate improved functional strength and transfer efficiency. Baseline: 17 sec Goal status: INITIAL  3.  Pt will improve TUG score to less than or equal to 13.5 sec for decreased fall risk. Baseline: 17.88 sec Goal status: INITIAL  LONG TERM GOALS: Target date: 03/31/2023  Pt will be independent with HEP for improved strength, balance, gait. Baseline:  Goal status: INITIAL  2.  Pt will improve DGI score to at least 19/24 to decrease fall risk. Baseline: 15/24  Goal status: INITIAL  3.  Pt will improve MCTSIB condition 4 to minimal sway for improved balance. Baseline: mod sway Goal status: INITIAL  4.  Pt will verbalize understanding of fall prevention in home environment.  Baseline:  Goal status: INITIAL   ASSESSMENT:  CLINICAL IMPRESSION: Patient is a 89 y.o. male who was seen today for physical therapy evaluation and treatment for abnormality of gait.   He reports having 4 falls in the past 6 months, 2 on outside surfaces and 2 in the home.  He reports having bout of Covid earlier this year and is more fatigued and more off balance since then.  He presents today with decreased functional strength, decreased balance, decreased dynamic gait.  He is at increased fall risk per FTSTS, TUG, DGI measures. He demonstrates decreased vestibular system use for balance on MCTSIB with increased sway on condition 4.  He is overall very active and does report fear of falling.  He will benefit from skilled PT to address the above stated deficits to decrease fall risk and improve overall functional mobility.  OBJECTIVE IMPAIRMENTS: Abnormal gait, decreased balance, decreased knowledge of use of DME, decreased mobility, difficulty walking, and decreased strength.   ACTIVITY LIMITATIONS: transfers,  locomotion level, and caring for others  PARTICIPATION LIMITATIONS: community activity and walking dog  PERSONAL FACTORS: 3+ comorbidities: see above  are also affecting patient's functional outcome.   REHAB POTENTIAL: Good  CLINICAL DECISION MAKING: Evolving/moderate complexity  EVALUATION COMPLEXITY: Moderate  PLAN:  PT FREQUENCY: 2x/week  PT DURATION: 6 weeks plus evaluation visit  PLANNED INTERVENTIONS: Therapeutic exercises, Therapeutic activity, Neuromuscular re-education, Balance training, Gait training, Patient/Family education, Self Care, Vestibular training, Canalith repositioning, DME instructions, and Manual therapy  PLAN FOR NEXT SESSION: Review initial HEP.  Check orthostatic BP measures.  Progress HEP and continue to work balance and hip strength.   Gean Maidens., PT 02/16/2023, 2:12 PM  [facility] at [facility] [address] [place], [address] [place], [place] [address] Phone # [phone] Fax # [phone]

## 2023-02-17 NOTE — Therapy (Signed)
OUTPATIENT PHYSICAL THERAPY NEURO TREATMENT   Patient Name: Jesse Hampton MRN: [id number] DOB:[birth year], 89 y.o., male Today's Date: 02/20/2023   PCP: Myrlene Broker, MD REFERRING PROVIDER: Andi Devon, MD   END OF SESSION:  PT End of Session - 02/20/23 1315     Visit Number 2    Number of Visits 13    Date for PT Re-Evaluation 03/31/23    Authorization Type [employer]    Authorization Time Period approved 13 PT visits from 02/16/2023-03/31/2023    Authorization - Visit Number 2    Authorization - Number of Visits 13    Progress Note Due on Visit 10    PT Start Time 1315    PT Stop Time 1357    PT Time Calculation (min) 42 min    Equipment Utilized During Treatment Gait belt    Activity Tolerance Patient tolerated treatment well    Behavior During Therapy WFL for tasks assessed/performed             Past Medical History:  Diagnosis Date   Anxiety    Arthritis    hands,   Chronic kidney disease    kidney stones   Depression    Past Surgical History:  Procedure Laterality Date   EYE SURGERY  2006   cartaract bil.   Patient Active Problem List   Diagnosis Date Noted   Family history of Alzheimer's disease 09/20/2016   Meniere disease 04/15/2016   Lumbar radiculopathy 07/07/2015   Routine general medical examination at a health care facility 04/02/2015   Thyroid nodule 04/02/2015   Chronic pruritus 12/25/2014   Benign prostatic hyperplasia 11/16/2014   Hyperlipidemia 11/16/2014   Major depression in remission (HCC) 11/16/2014   Allergic rhinitis 11/16/2014    ONSET DATE: 01/27/2023  REFERRING DIAG: R26.89 (ICD-10-CM) - Other abnormalities of gait and mobility   THERAPY DIAG:  Other abnormalities of gait and mobility  Unsteadiness on feet  Muscle weakness (generalized)  Rationale for Evaluation and Treatment: Rehabilitation  SUBJECTIVE:                                                                                                                                                                                              SUBJECTIVE STATEMENT: Still having balance problems. Denies problems with lightheadedness. Reports a sensation in his head- unable to decide dizziness vs. Imbalance. Reports that he is often dizzy at night and if he focuses his eyes it goes away. Also reports vertigo when he gets up to walk.   Pt accompanied by: self  PERTINENT HISTORY: See PMH above, hx of L ankle fracture  x 2 in the distant past  PAIN:  Are you having pain? No  PRECAUTIONS: Fall  WEIGHT BEARING RESTRICTIONS: No  FALLS: Has patient fallen in last 6 months? Yes. Number of falls 4 Falls occurred in the yard, driveway, had difficulty getting up from the ground.  Also fell in the house with a quick turn.     LIVING ENVIRONMENT: Lives with: lives with their spouse Lives in: House/apartment Stairs: Yes: External: 2 steps; on right going up and on left going up Has following equipment at home: None  PLOF: Independent and Leisure: walks dog 1.5 miles per day  PATIENT GOALS: To help with vertigo and to help with walking/balance.  OBJECTIVE:     TODAY'S TREATMENT: 02/20/23    Orthostatic Testing   Supine Sitting Standing  x1 Minute Standing x 3 Minutes  BP 125/52 123/52 119/55 125/55  HR 68 86 76 84  *pt denies dizziness throughout    Activity Comments  Review of initial HEP: romberg romberg EC SLS Slightly more sway with EC; cueing to vary UE support with SLS  romberg + head turns/nods 2x30" each Slow head movements; reporting more difficulty with head turns; no dizziness   fwd/back stepping 10x each  Weaning to 2 fingertip support   alt toe tap on cone 2x12 Good use of ankle strategy   backwards walking in II bars Cueing to widen BOS d/t heels nearly getting caught on eachother   walking with EC in II bars Noted difficulty            HOME EXERCISE PROGRAM Last updated: 02/20/23 Access Code:  [email or web address] URL: [email or web address] Date: 02/20/2023 Prepared by: [facility] - Outpatient  Rehab - [facility]  Exercises - Romberg Stance  - 1-2 x daily - 7 x weekly - 2 sets - 5 reps - Romberg Stance with Eyes Closed  - 1-2 x daily - 7 x weekly - 2 sets - 5 reps - Single Leg Stance with Support  - 1 x daily - 7 x weekly - 1 sets - 3 reps - 10 sec hold - Standing Toe Taps  - 1 x daily - 5 x weekly - 2 sets - 10 reps  PATIENT EDUCATION: Education details: edu on orthostatic test results ; HEP update to be performed at counter for safety Person educated: Patient Education method: Explanation, Demonstration, Tactile cues, Verbal cues, and Handouts Education comprehension: verbalized understanding and returned demonstration    Below measures were taken at time of initial evaluation unless otherwise specified:    *symptom description:  When asked about "vertigo" symptoms, pt reports no spinning sensations or motion sensitivity.  His symptom report is more unsteadiness and off balance feeling.  He does report light-headedness at times. * DIAGNOSTIC FINDINGS: NA for this episode  COGNITION: Overall cognitive status: Within functional limits for tasks assessed   SENSATION: WFL  POSTURE: rounded shoulders, forward head, and posterior pelvic tilt  LOWER EXTREMITY ROM:   Acitve ROM WFL  LOWER EXTREMITY MMT:  Grossly tested at least 4+/5 BLEs  TRANSFERS: Assistive device utilized: None  Sit to stand: Modified independence Stand to sit: Modified independence   GAIT: Gait pattern: step through pattern and decreased stride length Distance walked: 50 ft Assistive device utilized: None Level of assistance: SBA  FUNCTIONAL TESTS:  5 times sit to stand: 17 sec arms crossed at chest Timed up and go (TUG): 17.88 sec Functional gait assessment: NA 10 M:  10.94 sec (3 ft/sec) DGI:  15/24 (Scores <  19/24 indicate increased fall risk) SLS:  unable to stand either leg  without UE support  M-CTSIB  Condition 1: Firm Surface, EO 30 Sec, Normal Sway  Condition 2: Firm Surface, EC 30 Sec, Mild Sway  Condition 3: Foam Surface, EO 30 Sec, Mild Sway  Condition 4: Foam Surface, EC 30 Sec, Moderate Sway         TODAY'S TREATMENT:                                                                                                                              DATE: 02/16/2023    PATIENT EDUCATION: Education details: Eval results, POC, explained balance in relation to vestibular system; initiated HEP Person educated: Patient Education method: Explanation Education comprehension: verbalized understanding  HOME EXERCISE PROGRAM: Access Code: [email or web address] URL: [email or web address] Date: 02/16/2023 Prepared by: [facility] - Outpatient  Rehab - [facility]  Exercises - Romberg Stance  - 1-2 x daily - 7 x weekly - 2 sets - 5 reps - Romberg Stance with Eyes Closed  - 1-2 x daily - 7 x weekly - 2 sets - 5 reps - Single Leg Stance with Support  - 1 x daily - 7 x weekly - 1 sets - 3 reps - 10 sec hold  GOALS: Goals reviewed with patient? Yes  SHORT TERM GOALS: Target date: 03/17/2023  Pt will be independent with HEP for improved balance, strength, gait. Baseline: Goal status: IN PROGRESS  2.  Pt will improve 5x sit<>stand to less than or equal to 15 sec to demonstrate improved functional strength and transfer efficiency. Baseline: 17 sec Goal status: IN PROGRESS  3.  Pt will improve TUG score to less than or equal to 13.5 sec for decreased fall risk. Baseline: 17.88 sec Goal status: IN PROGRESS  LONG TERM GOALS: Target date: 03/31/2023  Pt will be independent with HEP for improved strength, balance, gait. Baseline:  Goal status: IN PROGRESS  2.  Pt will improve DGI score to at least 19/24 to decrease fall risk. Baseline: 15/24  Goal status: IN PROGRESS  3.  Pt will improve MCTSIB condition 4 to minimal sway for improved  balance. Baseline: mod sway Goal status: IN PROGRESS  4.  Pt will verbalize understanding of fall prevention in home environment.  Baseline:  Goal status: IN PROGRESS   ASSESSMENT:  CLINICAL IMPRESSION: Patient arrived to session without new complaints. Reports dizziness occurs at night and when standing up from a seated position. Orthostatics were negative. Proceeded with dynamic and static balance activities with limited UE support for max challenge. Upon further discussion at end of session, patient denied dizziness with all of today's activities and instead noted wobbliness. Patient reported understanding of all edu provided and without complaints at end of session.    OBJECTIVE IMPAIRMENTS: Abnormal gait, decreased balance, decreased knowledge of use of DME, decreased mobility, difficulty walking, and decreased strength.   ACTIVITY LIMITATIONS: transfers, locomotion level, and caring for  others  PARTICIPATION LIMITATIONS: community activity and walking dog  PERSONAL FACTORS: 3+ comorbidities: see above  are also affecting patient's functional outcome.   REHAB POTENTIAL: Good  CLINICAL DECISION MAKING: Evolving/moderate complexity  EVALUATION COMPLEXITY: Moderate  PLAN:  PT FREQUENCY: 2x/week  PT DURATION: 6 weeks plus evaluation visit  PLANNED INTERVENTIONS: Therapeutic exercises, Therapeutic activity, Neuromuscular re-education, Balance training, Gait training, Patient/Family education, Self Care, Vestibular training, Canalith repositioning, DME instructions, and Manual therapy  PLAN FOR NEXT SESSION: Progress HEP and continue to work balance and hip strength.    Anette Guarneri, PT, DPT 02/20/23 1:59 PM  [facility] [address] [place], [address] [place], [place] [address] Phone # [phone] Fax # [phone]

## 2023-02-20 ENCOUNTER — Encounter: Payer: Self-pay | Admitting: Physical Therapy

## 2023-02-20 ENCOUNTER — Ambulatory Visit: Payer: Medicare PPO | Admitting: Physical Therapy

## 2023-02-20 DIAGNOSIS — R2681 Unsteadiness on feet: Secondary | ICD-10-CM

## 2023-02-20 DIAGNOSIS — R2689 Other abnormalities of gait and mobility: Secondary | ICD-10-CM | POA: Diagnosis not present

## 2023-02-20 DIAGNOSIS — M6281 Muscle weakness (generalized): Secondary | ICD-10-CM

## 2023-02-22 NOTE — Therapy (Signed)
OUTPATIENT PHYSICAL THERAPY NEURO TREATMENT   Patient Name: Jesse Hampton MRN: [id number] DOB:July 11, 1930, 89 y.o., male Today's Date: 02/23/2023   PCP: Myrlene Broker, MD REFERRING PROVIDER: Andi Devon, MD   END OF SESSION:  PT End of Session - 02/23/23 1140     Visit Number 3    Number of Visits 13    Date for PT Re-Evaluation 03/31/23    Authorization Type [employer]    Authorization Time Period approved 13 PT visits from 02/16/2023-03/31/2023    Authorization - Visit Number 3    Authorization - Number of Visits 13    Progress Note Due on Visit 10    PT Start Time 1059    PT Stop Time 1139    PT Time Calculation (min) 40 min    Equipment Utilized During Treatment Gait belt    Activity Tolerance Patient tolerated treatment well    Behavior During Therapy WFL for tasks assessed/performed              Past Medical History:  Diagnosis Date   Anxiety    Arthritis    hands,   Chronic kidney disease    kidney stones   Depression    Past Surgical History:  Procedure Laterality Date   EYE SURGERY  2006   cartaract bil.   Patient Active Problem List   Diagnosis Date Noted   Family history of Alzheimer's disease 09/20/2016   Meniere disease 04/15/2016   Lumbar radiculopathy 07/07/2015   Routine general medical examination at a health care facility 04/02/2015   Thyroid nodule 04/02/2015   Chronic pruritus 12/25/2014   Benign prostatic hyperplasia 11/16/2014   Hyperlipidemia 11/16/2014   Major depression in remission (HCC) 11/16/2014   Allergic rhinitis 11/16/2014    ONSET DATE: 01/27/2023  REFERRING DIAG: R26.89 (ICD-10-CM) - Other abnormalities of gait and mobility   THERAPY DIAG:  Other abnormalities of gait and mobility  Unsteadiness on feet  Muscle weakness (generalized)  Rationale for Evaluation and Treatment: Rehabilitation  SUBJECTIVE:                                                                                                                                                                                              SUBJECTIVE STATEMENT: Very tired from travelling to [place].    Pt accompanied by: self  PERTINENT HISTORY: See PMH above, hx of L ankle fracture x 2 in the distant past  PAIN:  Are you having pain? No  PRECAUTIONS: Fall  WEIGHT BEARING RESTRICTIONS: No  FALLS: Has patient fallen in last 6 months? Yes. Number of falls 4 Falls occurred  in the yard, driveway, had difficulty getting up from the ground.  Also fell in the house with a quick turn.     LIVING ENVIRONMENT: Lives with: lives with their spouse Lives in: House/apartment Stairs: Yes: External: 2 steps; on right going up and on left going up Has following equipment at home: None  PLOF: Independent and Leisure: walks dog 1.5 miles per day  PATIENT GOALS: To help with vertigo and to help with walking/balance.  OBJECTIVE:     TODAY'S TREATMENT: 02/23/23 Activity Comments  1/4 and 1/2 turns in II bars Cueing to limit # of steps in order to perform with large efficient steps. Some imbalance requiring min A  step up + opposite SKTC 10x each Weaning UE support to fingertip only; occasional cueing for proper sequencing   toe tap on 1st, 2nd step, down 10x  CGA d/t minor imbalance   sidestepping onto/off foam  Able to wean to no UE support; cueing to look down for safe foot placement and elongate steps accordingly   backwards step over 1/2 foam roll Able to wean to no UE support but requires CGA-min A  backwards walking, then with vertical and horizontal ball toss Cueing for slower and longer steps      HOME EXERCISE PROGRAM Last updated: 02/23/23 Access Code: [email or web address] URL: [email or web address] Date: 02/23/2023 Prepared by: [facility] - Outpatient  Rehab - [facility]  Exercises - Romberg Stance  - 1-2 x daily - 7 x weekly - 2 sets - 5 reps - Romberg Stance with Eyes Closed  - 1-2 x daily - 7 x weekly - 2  sets - 5 reps - Single Leg Stance with Support  - 1 x daily - 7 x weekly - 1 sets - 3 reps - 10 sec hold - Standing Toe Taps  - 1 x daily - 5 x weekly - 2 sets - 10 reps - Lateral Step Up with Counter Support  - 1 x daily - 5 x weekly - 2 sets - 10 reps    PATIENT EDUCATION: Education details: HEP update with edu on safety; clinical reasoning behind today's activities  Person educated: Patient Education method: Explanation, Demonstration, Tactile cues, Verbal cues, and Handouts Education comprehension: verbalized understanding and returned demonstration     Below measures were taken at time of initial evaluation unless otherwise specified:    *symptom description:  When asked about "vertigo" symptoms, pt reports no spinning sensations or motion sensitivity.  His symptom report is more unsteadiness and off balance feeling.  He does report light-headedness at times. * DIAGNOSTIC FINDINGS: NA for this episode  COGNITION: Overall cognitive status: Within functional limits for tasks assessed   SENSATION: WFL  POSTURE: rounded shoulders, forward head, and posterior pelvic tilt  LOWER EXTREMITY ROM:   Acitve ROM WFL  LOWER EXTREMITY MMT:  Grossly tested at least 4+/5 BLEs  TRANSFERS: Assistive device utilized: None  Sit to stand: Modified independence Stand to sit: Modified independence   GAIT: Gait pattern: step through pattern and decreased stride length Distance walked: 50 ft Assistive device utilized: None Level of assistance: SBA  FUNCTIONAL TESTS:  5 times sit to stand: 17 sec arms crossed at chest Timed up and go (TUG): 17.88 sec Functional gait assessment: NA 10 M:  10.94 sec (3 ft/sec) DGI:  15/24 (Scores <19/24 indicate increased fall risk) SLS:  unable to stand either leg without UE support  M-CTSIB  Condition 1: Firm Surface, EO 30 Sec, Normal Sway  Condition 2:  Firm Surface, EC 30 Sec, Mild Sway  Condition 3: Foam Surface, EO 30 Sec, Mild Sway   Condition 4: Foam Surface, EC 30 Sec, Moderate Sway         TODAY'S TREATMENT:                                                                                                                              DATE: 02/16/2023    PATIENT EDUCATION: Education details: Eval results, POC, explained balance in relation to vestibular system; initiated HEP Person educated: Patient Education method: Explanation Education comprehension: verbalized understanding  HOME EXERCISE PROGRAM: Access Code: [email or web address] URL: [email or web address] Date: 02/16/2023 Prepared by: [facility] - Outpatient  Rehab - [facility]  Exercises - Romberg Stance  - 1-2 x daily - 7 x weekly - 2 sets - 5 reps - Romberg Stance with Eyes Closed  - 1-2 x daily - 7 x weekly - 2 sets - 5 reps - Single Leg Stance with Support  - 1 x daily - 7 x weekly - 1 sets - 3 reps - 10 sec hold  GOALS: Goals reviewed with patient? Yes  SHORT TERM GOALS: Target date: 03/17/2023  Pt will be independent with HEP for improved balance, strength, gait. Baseline: Goal status: IN PROGRESS  2.  Pt will improve 5x sit<>stand to less than or equal to 15 sec to demonstrate improved functional strength and transfer efficiency. Baseline: 17 sec Goal status: IN PROGRESS  3.  Pt will improve TUG score to less than or equal to 13.5 sec for decreased fall risk. Baseline: 17.88 sec Goal status: IN PROGRESS  LONG TERM GOALS: Target date: 03/31/2023  Pt will be independent with HEP for improved strength, balance, gait. Baseline:  Goal status: IN PROGRESS  2.  Pt will improve DGI score to at least 19/24 to decrease fall risk. Baseline: 15/24  Goal status: IN PROGRESS  3.  Pt will improve MCTSIB condition 4 to minimal sway for improved balance. Baseline: mod sway Goal status: IN PROGRESS  4.  Pt will verbalize understanding of fall prevention in home environment.  Baseline:  Goal status: IN  PROGRESS   ASSESSMENT:  CLINICAL IMPRESSION: Patient arrived to session with report of feeling tired after traveling. Worked on turns with focus on large efficient steps to complete. Completed progression of SLS activities with occasional light UE support required. Also worked on compliant surface and stair activities which required cueing for safe foot placement.  Updated HEP with compliant surface training which was well-tolerated today. Patient reported understanding and without complaints upon leaving.    OBJECTIVE IMPAIRMENTS: Abnormal gait, decreased balance, decreased knowledge of use of DME, decreased mobility, difficulty walking, and decreased strength.   ACTIVITY LIMITATIONS: transfers, locomotion level, and caring for others  PARTICIPATION LIMITATIONS: community activity and walking dog  PERSONAL FACTORS: 3+ comorbidities: see above  are also affecting patient's functional outcome.   REHAB POTENTIAL: Good  CLINICAL DECISION MAKING: Evolving/moderate  complexity  EVALUATION COMPLEXITY: Moderate  PLAN:  PT FREQUENCY: 2x/week  PT DURATION: 6 weeks plus evaluation visit  PLANNED INTERVENTIONS: Therapeutic exercises, Therapeutic activity, Neuromuscular re-education, Balance training, Gait training, Patient/Family education, Self Care, Vestibular training, Canalith repositioning, DME instructions, and Manual therapy  PLAN FOR NEXT SESSION: Progress HEP and continue to work balance and hip strength.    Anette Guarneri, PT, DPT 02/23/23 11:41 AM  [facility] at [facility] [address] [place], [address] [place], [place] [address] Phone # [phone] Fax # [phone]

## 2023-02-23 ENCOUNTER — Encounter: Payer: Self-pay | Admitting: Physical Therapy

## 2023-02-23 ENCOUNTER — Ambulatory Visit: Payer: Medicare PPO | Attending: Internal Medicine | Admitting: Physical Therapy

## 2023-02-23 DIAGNOSIS — R2681 Unsteadiness on feet: Secondary | ICD-10-CM

## 2023-02-23 DIAGNOSIS — R2689 Other abnormalities of gait and mobility: Secondary | ICD-10-CM | POA: Insufficient documentation

## 2023-02-23 DIAGNOSIS — M6281 Muscle weakness (generalized): Secondary | ICD-10-CM | POA: Diagnosis not present

## 2023-02-27 NOTE — Therapy (Signed)
  OUTPATIENT PHYSICAL THERAPY NEURO TREATMENT   Patient Name: Jesse Hampton MRN: [id number] DOB:04-14-1930, 89 y.o., male Today's Date: 02/28/2023  PCP: Myrlene Broker, MD REFERRING PROVIDER: Andi Devon, MD  ONSET DATE: 01/27/2023  REFERRING DIAG: R26.89 (ICD-10-CM) - Other abnormalities of gait and mobility   THERAPY DIAG:  Other abnormalities of gait and mobility  Unsteadiness on feet  Muscle weakness (generalized)  Rationale for Evaluation and Treatment: Rehabilitation  SUBJECTIVE:                                                                                                                                                                                             SUBJECTIVE STATEMENT: Granddaughter who is a drug addict is in the hospital and he has been up with her for a long time. Asking to leave but has a few questions about PT. Pt mentions dizziness when he lays down in bed which has been ongoing for a while.     CLINICAL IMPRESSION: Patient arrived to session with report of not being able to stay for appointment d/t granddaughter being in the hospital. Patient did also mention that he has been experiencing dizziness when laying down in bed. Plan for vestibular assessment next time. Pt left without being seen.   Anette Guarneri, PT, DPT 02/28/23 10:21 AM  [facility] at [facility] [address] [place], [address] [place], [place] [address] Phone # [phone] Fax # [phone]

## 2023-02-28 ENCOUNTER — Encounter: Payer: Self-pay | Admitting: Physical Therapy

## 2023-02-28 ENCOUNTER — Ambulatory Visit: Payer: Medicare PPO | Admitting: Physical Therapy

## 2023-02-28 DIAGNOSIS — R2689 Other abnormalities of gait and mobility: Secondary | ICD-10-CM

## 2023-02-28 DIAGNOSIS — R2681 Unsteadiness on feet: Secondary | ICD-10-CM

## 2023-02-28 DIAGNOSIS — M6281 Muscle weakness (generalized): Secondary | ICD-10-CM

## 2023-03-02 ENCOUNTER — Ambulatory Visit: Payer: Medicare PPO | Admitting: Physical Therapy

## 2023-03-06 NOTE — Therapy (Addendum)
 OUTPATIENT PHYSICAL THERAPY NEURO TREATMENT   Patient Name: Jesse Hampton MRN: [id number] DOB:11-30-1929, 89 y.o., male Today's Date: 03/07/2023   PCP: Myrlene Broker, MD REFERRING PROVIDER: Andi Devon, MD   END OF SESSION:  PT End of Session - 03/07/23 1141     Visit Number 4    Number of Visits 13    Date for PT Re-Evaluation 03/31/23    Authorization Type [employer]    Authorization Time Period approved 13 PT visits from 02/16/2023-03/31/2023    Authorization - Visit Number 4    Authorization - Number of Visits 13    Progress Note Due on Visit 10    PT Start Time 1100    PT Stop Time 1141    PT Time Calculation (min) 41 min    Activity Tolerance Patient tolerated treatment well    Behavior During Therapy WFL for tasks assessed/performed               Past Medical History:  Diagnosis Date   Anxiety    Arthritis    hands,   Chronic kidney disease    kidney stones   Depression    Past Surgical History:  Procedure Laterality Date   EYE SURGERY  2006   cartaract bil.   Patient Active Problem List   Diagnosis Date Noted   Family history of Alzheimer's disease 09/20/2016   Meniere disease 04/15/2016   Lumbar radiculopathy 07/07/2015   Routine general medical examination at a health care facility 04/02/2015   Thyroid nodule 04/02/2015   Chronic pruritus 12/25/2014   Benign prostatic hyperplasia 11/16/2014   Hyperlipidemia 11/16/2014   Major depression in remission (HCC) 11/16/2014   Allergic rhinitis 11/16/2014    ONSET DATE: 01/27/2023  REFERRING DIAG: R26.89 (ICD-10-CM) - Other abnormalities of gait and mobility   THERAPY DIAG:  Other abnormalities of gait and mobility  Unsteadiness on feet  Muscle weakness (generalized)  Rationale for Evaluation and Treatment: Rehabilitation  SUBJECTIVE:                                                                                                                                                                                              SUBJECTIVE STATEMENT: Granddaughter is still in the hospital. He is having more difficulty walking in straight lines. Denies hx of vertigo, head trauma, migraines. Reports that he had a brain MRI at the [facility] and it did not show any acute findings.    Pt accompanied by: self  PERTINENT HISTORY: See PMH above, hx of L ankle fracture x 2 in the distant past  PAIN:  Are you  having pain? No  PRECAUTIONS: Fall  WEIGHT BEARING RESTRICTIONS: No  FALLS: Has patient fallen in last 6 months? Yes. Number of falls 4 Falls occurred in the yard, driveway, had difficulty getting up from the ground.  Also fell in the house with a quick turn.     LIVING ENVIRONMENT: Lives with: lives with their spouse Lives in: House/apartment Stairs: Yes: External: 2 steps; on right going up and on left going up Has following equipment at home: None  PLOF: Independent and Leisure: walks dog 1.5 miles per day  PATIENT GOALS: To help with vertigo and to help with walking/balance.  OBJECTIVE:    TODAY'S TREATMENT: 03/07/23  VESTIBULAR ASSESSMENT   GENERAL OBSERVATION: pt wears trifocals all day    SYMPTOM BEHAVIOR:   Subjective history: reports no hx of vertigo   Non-Vestibular symptoms: diplopia and reports this is d/t his glasses Rx ; reports it occurs when reading and he looks up this occurs (has occurred for months and is getting worse)   OCULOMOTOR EXAM:   Ocular Alignment:  head tilted L (notes neck stiffness)   Ocular ROM: No Limitations   Spontaneous Nystagmus: absent   Gaze-Induced Nystagmus: absent   Smooth Pursuits:  saccades in vertical direction   Saccades: intact   Convergence/Divergence: 4 in limited by L eye   VESTIBULAR - OCULAR REFLEX:    Slow VOR: Normal; c/o very mild dizziness vertical    VOR Cancellation: Unable to Maintain Gaze worse to R vs. L side    Head-Impulse Test: difficult to discern HIT d/t significant  muscle guarding       POSITIONAL TESTING:  Right Roll Test: negative; Duration: c/o dizziness which increased the longer position was held Left Roll Test: c/o dizziness which increased the longer position was held Right Sidelying: negative; Duration: c/o mild dizziness upon laying down and sitting up  Left Sidelying: negative; c/o dizziness upon sitting up      PATIENT EDUCATION: Education details: encouraged to speak to eye MD about diplopia d/t pt's report of clear brain MRI in December, HEP update, answered pt's questions on exam findings and functional relevance  Person educated: Patient Education method: Explanation, Demonstration, Tactile cues, Verbal cues, and Handouts Education comprehension: verbalized understanding and returned demonstration   HOME EXERCISE PROGRAM Access Code: [email or web address] URL: [email or web address] Date: 03/07/2023 Prepared by: [facility] - Outpatient  Rehab - [facility]  Exercises - Romberg Stance  - 1-2 x daily - 7 x weekly - 2 sets - 5 reps - Romberg Stance with Eyes Closed  - 1-2 x daily - 7 x weekly - 2 sets - 5 reps - Single Leg Stance with Support  - 1 x daily - 7 x weekly - 1 sets - 3 reps - 10 sec hold - Standing Toe Taps  - 1 x daily - 5 x weekly - 2 sets - 10 reps - Lateral Step Up with Counter Support  - 1 x daily - 5 x weekly - 2 sets - 10 reps - Brandt-Daroff Vestibular Exercise  - 1 x daily - 5 x weekly - 2 sets - 3-5 reps - Seated Gaze Stabilization with Head Rotation  - 1 x daily - 5 x weekly - 2 sets - 30 sec hold - Seated Gaze Stabilization with Head Nod  - 1 x daily - 5 x weekly - 2 sets - 30 sec hold      Below measures were taken at time of initial evaluation unless otherwise specified:    *  symptom description:  When asked about "vertigo" symptoms, pt reports no spinning sensations or motion sensitivity.  His symptom report is more unsteadiness and off balance feeling.  He does report light-headedness at  times. * DIAGNOSTIC FINDINGS: NA for this episode  COGNITION: Overall cognitive status: Within functional limits for tasks assessed   SENSATION: WFL  POSTURE: rounded shoulders, forward head, and posterior pelvic tilt  LOWER EXTREMITY ROM:   Acitve ROM WFL  LOWER EXTREMITY MMT:  Grossly tested at least 4+/5 BLEs  TRANSFERS: Assistive device utilized: None  Sit to stand: Modified independence Stand to sit: Modified independence   GAIT: Gait pattern: step through pattern and decreased stride length Distance walked: 50 ft Assistive device utilized: None Level of assistance: SBA  FUNCTIONAL TESTS:  5 times sit to stand: 17 sec arms crossed at chest Timed up and go (TUG): 17.88 sec Functional gait assessment: NA 10 M:  10.94 sec (3 ft/sec) DGI:  15/24 (Scores <19/24 indicate increased fall risk) SLS:  unable to stand either leg without UE support  M-CTSIB  Condition 1: Firm Surface, EO 30 Sec, Normal Sway  Condition 2: Firm Surface, EC 30 Sec, Mild Sway  Condition 3: Foam Surface, EO 30 Sec, Mild Sway  Condition 4: Foam Surface, EC 30 Sec, Moderate Sway         TODAY'S TREATMENT:                                                                                                                              DATE: 02/16/2023    PATIENT EDUCATION: Education details: Eval results, POC, explained balance in relation to vestibular system; initiated HEP Person educated: Patient Education method: Explanation Education comprehension: verbalized understanding  HOME EXERCISE PROGRAM: Access Code: [email or web address] URL: [email or web address] Date: 02/16/2023 Prepared by: [facility] - Outpatient  Rehab - [facility]  Exercises - Romberg Stance  - 1-2 x daily - 7 x weekly - 2 sets - 5 reps - Romberg Stance with Eyes Closed  - 1-2 x daily - 7 x weekly - 2 sets - 5 reps - Single Leg Stance with Support  - 1 x daily - 7 x weekly - 1 sets - 3 reps - 10 sec  hold  GOALS: Goals reviewed with patient? Yes  SHORT TERM GOALS: Target date: 03/17/2023  Pt will be independent with HEP for improved balance, strength, gait. Baseline: Goal status: IN PROGRESS  2.  Pt will improve 5x sit<>stand to less than or equal to 15 sec to demonstrate improved functional strength and transfer efficiency. Baseline: 17 sec Goal status: IN PROGRESS  3.  Pt will improve TUG score to less than or equal to 13.5 sec for decreased fall risk. Baseline: 17.88 sec Goal status: IN PROGRESS  LONG TERM GOALS: Target date: 03/31/2023  Pt will be independent with HEP for improved strength, balance, gait. Baseline:  Goal status: IN PROGRESS  2.  Pt will improve DGI score to  at least 19/24 to decrease fall risk. Baseline: 15/24  Goal status: IN PROGRESS  3.  Pt will improve MCTSIB condition 4 to minimal sway for improved balance. Baseline: mod sway Goal status: IN PROGRESS  4.  Pt will verbalize understanding of fall prevention in home environment.  Baseline:  Goal status: IN PROGRESS   ASSESSMENT:  CLINICAL IMPRESSION: Patient arrived to session without new complaints. Reports that he is still experiencing dizziness when laying down at times. Denies hx of vertigo, head trauma, migraines. Reports that he had a brain MRI at the [facility] and it did not show any acute findings(this is not available to in Epic). Exam today revealed saccades with vertical smooth pursuits, L eye convergence insufficiency, dizziness with vertical VOR, difficulty maintaining gaze with VOR cancellation, and motion sensitivity. D/t patient's report of having had clear brain imaging recently, will treat with habituation and VOR activities to see if symptoms improve and refer out if patient does not respond. Did recommend f/u with ophthalmology d/t c/o diplopia. Patient reported understanding of edu and without complaints upon leaving.    OBJECTIVE IMPAIRMENTS: Abnormal gait, decreased  balance, decreased knowledge of use of DME, decreased mobility, difficulty walking, and decreased strength.   ACTIVITY LIMITATIONS: transfers, locomotion level, and caring for others  PARTICIPATION LIMITATIONS: community activity and walking dog  PERSONAL FACTORS: 3+ comorbidities: see above  are also affecting patient's functional outcome.   REHAB POTENTIAL: Good  CLINICAL DECISION MAKING: Evolving/moderate complexity  EVALUATION COMPLEXITY: Moderate  PLAN:  PT FREQUENCY: 2x/week  PT DURATION: 6 weeks plus evaluation visit  PLANNED INTERVENTIONS: Therapeutic exercises, Therapeutic activity, Neuromuscular re-education, Balance training, Gait training, Patient/Family education, Self Care, Vestibular training, Canalith repositioning, DME instructions, and Manual therapy  PLAN FOR NEXT SESSION: Progress HEP and continue to work balance and hip strength.    Anette Guarneri, PT, DPT 03/07/23 11:42 AM  [facility] at [facility] [address] [place], [address] [place], [place] [address] Phone # [phone] Fax # [phone]    PHYSICAL THERAPY DISCHARGE SUMMARY  Visits from Start of Care: 4  Current functional level related to goals / functional outcomes: Unable to assess; pt did not return   Remaining deficits: Unable to assess   Education / Equipment: HEP  Plan: Patient agrees to discharge.  Patient goals were not met. Patient is being discharged due to not returning    Baldemar Friday, [place], DPT 01/08/24 4:11 PM  [facility] at [facility] [address] [place], [address] [place], [place] [address] Phone # [phone] Fax # [phone]

## 2023-03-07 ENCOUNTER — Ambulatory Visit: Payer: Medicare PPO | Admitting: Physical Therapy

## 2023-03-07 ENCOUNTER — Encounter: Payer: Self-pay | Admitting: Physical Therapy

## 2023-03-07 DIAGNOSIS — R2681 Unsteadiness on feet: Secondary | ICD-10-CM | POA: Diagnosis not present

## 2023-03-07 DIAGNOSIS — R2689 Other abnormalities of gait and mobility: Secondary | ICD-10-CM

## 2023-03-07 DIAGNOSIS — M6281 Muscle weakness (generalized): Secondary | ICD-10-CM | POA: Diagnosis not present

## 2023-03-09 ENCOUNTER — Ambulatory Visit: Payer: Medicare PPO | Admitting: Physical Therapy

## 2023-03-13 DIAGNOSIS — F3289 Other specified depressive episodes: Secondary | ICD-10-CM | POA: Diagnosis not present

## 2023-03-13 DIAGNOSIS — R5383 Other fatigue: Secondary | ICD-10-CM | POA: Diagnosis not present

## 2023-03-14 ENCOUNTER — Ambulatory Visit: Payer: Medicare PPO | Admitting: Rehabilitative and Restorative Service Providers"

## 2023-03-16 ENCOUNTER — Ambulatory Visit: Payer: Medicare PPO

## 2023-04-12 DIAGNOSIS — I951 Orthostatic hypotension: Secondary | ICD-10-CM | POA: Diagnosis not present

## 2023-04-12 DIAGNOSIS — R2681 Unsteadiness on feet: Secondary | ICD-10-CM | POA: Diagnosis not present

## 2023-04-12 DIAGNOSIS — R42 Dizziness and giddiness: Secondary | ICD-10-CM | POA: Diagnosis not present

## 2023-04-26 DIAGNOSIS — R42 Dizziness and giddiness: Secondary | ICD-10-CM | POA: Diagnosis not present

## 2024-01-13 ENCOUNTER — Emergency Department (HOSPITAL_BASED_OUTPATIENT_CLINIC_OR_DEPARTMENT_OTHER)
Admission: EM | Admit: 2024-01-13 | Discharge: 2024-01-13 | Disposition: A | Discharge status: Home / Self Care | Attending: Emergency Medicine | Admitting: Emergency Medicine

## 2024-01-13 ENCOUNTER — Emergency Department (HOSPITAL_BASED_OUTPATIENT_CLINIC_OR_DEPARTMENT_OTHER)

## 2024-01-13 ENCOUNTER — Encounter (HOSPITAL_BASED_OUTPATIENT_CLINIC_OR_DEPARTMENT_OTHER): Payer: Self-pay | Admitting: Emergency Medicine

## 2024-01-13 ENCOUNTER — Other Ambulatory Visit: Payer: Self-pay

## 2024-01-13 DIAGNOSIS — S60519A Abrasion of unspecified hand, initial encounter: Secondary | ICD-10-CM | POA: Insufficient documentation

## 2024-01-13 DIAGNOSIS — W010XXA Fall on same level from slipping, tripping and stumbling without subsequent striking against object, initial encounter: Secondary | ICD-10-CM | POA: Insufficient documentation

## 2024-01-13 DIAGNOSIS — W19XXXA Unspecified fall, initial encounter: Secondary | ICD-10-CM

## 2024-01-13 DIAGNOSIS — N189 Chronic kidney disease, unspecified: Secondary | ICD-10-CM | POA: Diagnosis not present

## 2024-01-13 DIAGNOSIS — S0081XA Abrasion of other part of head, initial encounter: Secondary | ICD-10-CM

## 2024-01-13 DIAGNOSIS — S0083XA Contusion of other part of head, initial encounter: Secondary | ICD-10-CM | POA: Insufficient documentation

## 2024-01-13 DIAGNOSIS — S0990XA Unspecified injury of head, initial encounter: Secondary | ICD-10-CM | POA: Diagnosis present

## 2024-01-13 MED ORDER — BACITRACIN ZINC 500 UNIT/GM EX OINT: TOPICAL_OINTMENT | Freq: Once | CUTANEOUS | Status: DC

## 2024-01-13 MED ORDER — ACETAMINOPHEN 500 MG PO TABS
1000.0000 mg | ORAL_TABLET | Freq: Once | ORAL | Status: AC
  Administered 2024-01-13: 1000 mg via ORAL
  Filled 2024-01-13: qty 2

## 2024-01-13 NOTE — ED Notes (Signed)
 Pt refused bacitracin, stated he would put it on when he gets home.

## 2024-01-13 NOTE — Discharge Instructions (Addendum)
 Your trauma imaging was negative.  Recommend cleaning your wounds/abrasions with warm soapy water and applying bacitracin ointment and daily.  Follow-up with your PCP.  Ice your forehead/eyelid hematoma for the next 24 hours.  Take Tylenol for pain control.

## 2024-01-13 NOTE — ED Triage Notes (Signed)
 Pt reports mechanical fall while walking his dog this morning. Pt reports hitting his head on the street. Denis LOC, denies blood thinner use. Pt reports face pain, and left knee pain.

## 2024-01-13 NOTE — ED Provider Notes (Signed)
 [facility] Provider Note   CSN: [id number] Arrival date & time: 01/13/24  1102     History  Chief Complaint  Patient presents with   Jesse Hampton is a 89 y.o. male.   Fall     89 year old male with medical history significant for depression, hyperlipidemia, Mnire's disease, anxiety, CKD presenting to the emergency department with facial abrasions after a ground-level fall.  The patient states that prior to arrival he was walking a dog earlier this morning.  He tripped and fell forward landing on his face sustaining abrasions.  He denies loss of consciousness.  He is not on anticoagulation.  He sustained abrasions to his bilateral hands.  His tetanus is up-to-date as of 2023.  No nausea or vomiting.  He denies any other injuries or complaints.  He arrives GCS 15, ABC intact.  Home Medications Prior to Admission medications   Medication Sig Start Date End Date Taking? Authorizing Provider  betamethasone dipropionate (DIPROLENE) 0.05 % cream Apply topically 2 (two) times daily. 09/17/18   Myrlene Broker, MD  buPROPion (WELLBUTRIN XL) 300 MG 24 hr tablet TAKE 1 TABLET BY MOUTH EVERY MORNING. NEED OFFICE VISIT FOR FURTHER REFILLS 09/02/19   Myrlene Broker, MD  fexofenadine (ALLEGRA) 180 MG tablet Take 180 mg by mouth as needed.     [provider]  meclizine (ANTIVERT) 25 MG tablet Take 1 tablet (25 mg total) by mouth 2 (two) times daily as needed for dizziness. 05/10/17   Emi Belfast, FNP  meloxicam (MOBIC) 15 MG tablet Take 1 tablet (15 mg total) by mouth daily as needed for pain. 09/17/18   Myrlene Broker, MD  ondansetron (ZOFRAN) 4 MG tablet Take 1 tablet (4 mg total) by mouth every 8 (eight) hours as needed for nausea or vomiting. 08/08/17   Nche, Bonna Gains, NP  simvastatin (ZOCOR) 20 MG tablet Take 1 tablet (20 mg total) by mouth at bedtime. NEED OFFICE VISIT FOR FURTHER REFILLS  09/02/19   Myrlene Broker, MD  tamsulosin [facility]) 0.4 MG CAPS capsule TAKE ONE CAPSULE BY MOUTH DAILY AFTER SUPPER 09/17/18   Myrlene Broker, MD      Allergies    Patient has no known allergies.    Review of Systems   Review of Systems  All other systems reviewed and are negative.   Physical Exam Updated Vital Signs BP (!) 144/54 (BP Location: Left Arm)   Pulse 65   Temp 98.2 F (36.8 C) (Oral)   Resp 18   Ht 5\' 6"  (1.676 m)   Wt 68.9 kg   SpO2 98%   BMI 24.53 kg/m  Physical Exam Vitals and nursing note reviewed.  Constitutional:      Appearance: He is well-developed.     Comments: GCS 15, ABC intact  HENT:     Head: Normocephalic.     Comments: Scattered facial abrasions, periorbital ecchymosis on the right Eyes:     Conjunctiva/sclera: Conjunctivae normal.  Neck:     Comments: No midline tenderness to palpation of the cervical spine. ROM intact. Cardiovascular:     Rate and Rhythm: Normal rate and regular rhythm.  Pulmonary:     Effort: Pulmonary effort is normal. No respiratory distress.     Breath sounds: Normal breath sounds.  Chest:     Comments: Chest wall stable and non-tender to AP and lateral compression. Clavicles stable and non-tender to AP compression Abdominal:  Palpations: Abdomen is soft.     Tenderness: There is no abdominal tenderness.     Comments: Pelvis stable to lateral compression.  Musculoskeletal:     Cervical back: Neck supple.     Comments: No midline tenderness to palpation of the thoracic or lumbar spine. Extremities atraumatic with intact ROM with the exception of bilateral palmar abrasions, no bony tenderness of the hands, no anatomic scaphoid tenderness bilaterally, no wrist tenderness  Skin:    General: Skin is warm and dry.  Neurological:     Mental Status: He is alert.     Comments: CN II-XII grossly intact. Moving all four extremities spontaneously and sensation grossly intact.     ED Results / Procedures /  Treatments   Labs (all labs ordered are listed, but only abnormal results are displayed) Labs Reviewed - No data to display  EKG None  Radiology DG Chest Portable 1 View Result Date: 01/13/2024 CLINICAL DATA:  Fall.  Bruising across face. EXAM: PORTABLE CHEST 1 VIEW COMPARISON:  07/29/2009 FINDINGS: Suspect remote right rib fractures. Midline trachea. Mild cardiomegaly. Tortuous thoracic aorta. No pleural effusion or pneumothorax. Interstitial thickening/coarsening is mild, nonspecific in this age group. IMPRESSION: No acute or posttraumatic deformity identified. Cardiomegaly without congestive failure. Electronically Signed   By: Jeronimo Greaves M.D.   On: 01/13/2024 12:42   CT Head Wo Contrast Result Date: 01/13/2024 CLINICAL DATA:  Trauma to head and neck. EXAM: CT HEAD WITHOUT CONTRAST CT MAXILLOFACIAL WITHOUT CONTRAST CT CERVICAL SPINE WITHOUT CONTRAST TECHNIQUE: Multidetector CT imaging of the head, cervical spine, and maxillofacial structures were performed using the standard protocol without intravenous contrast. Multiplanar CT image reconstructions of the cervical spine and maxillofacial structures were also generated. RADIATION DOSE REDUCTION: This exam was performed according to the departmental dose-optimization program which includes automated exposure control, adjustment of the mA and/or kV according to patient size and/or use of iterative reconstruction technique. COMPARISON:  None Available. FINDINGS: CT HEAD FINDINGS Brain: No acute intracranial hemorrhage. No CT evidence of acute infarct. Nonspecific hypoattenuation in the periventricular and subcortical white matter favored to reflect chronic microvascular ischemic changes. Mild generalized parenchymal volume loss. No edema, mass effect, or midline shift. The basilar cisterns are patent. Ventricles: Prominence of the ventricles suggesting underlying parenchymal volume loss. Vascular: Atherosclerotic calcifications of the carotid siphons.  No hyperdense vessel. Skull: No acute or aggressive finding. Other: Mastoid air cells are clear. CT MAXILLOFACIAL FINDINGS Osseous: Maxilla: Intact Pterygoid Plates: Intact Zygomatic Arch: Intact Orbits: Intact Ethmoid: Intact Sphenoid: Intact Frontal:Intact Mandible: Intact. No condylar dislocation. Nasal: Intact Nasal Septum: Intact.  Leftward bowing of the nasal septum. Orbits: Globes are intact. Bilateral lens replacement. Extraocular muscles and optic nerve sheath complexes are unremarkable. No retrobulbar hematoma. Soft tissue swelling over the right forehead extending over the supraorbital ridge into the right preseptal soft tissues with thickening of the right upper eyelid. Left preseptal soft tissues are unremarkable. Sinuses: Mucosal thickening and mucous retention cyst in the right maxillary sinus. Additional mucosal thickening in the alveolar recess of the left maxillary sinus. Moderate scattered mucosal thickening in the ethmoid sinuses slightly greater on the left. Soft tissues: Soft tissue swelling in the right aspect of the forehead extending over the supraorbital ridge in involving the right preseptal soft tissues. Superficial facial soft tissues are otherwise unremarkable. Visualized deep spaces of the neck without acute abnormality. CT CERVICAL SPINE FINDINGS Alignment: Alignment is maintained. No listhesis. No facet subluxation or dislocation. Mild dextrocurvature of the cervical and upper thoracic  spine. Skull base and vertebrae: No compression fracture or displaced fracture in the cervical spine. Soft tissues and spinal canal: No prevertebral fluid or swelling. No visible canal hematoma. Large left thyroid nodule measuring 4.4 x 3.8 cm. Disc levels: Moderate disc space narrowing at multiple levels. Disc osteophyte complexes most pronounced at C4-5 and C6-7. No high-grade osseous spinal canal stenosis. Facet arthrosis at multiple levels. Significant foraminal narrowing at multiple levels. Upper  chest: Negative. Other: None. IMPRESSION: No CT evidence of acute intracranial abnormality. No acute maxillofacial fracture. Right forehead hematoma extending over the supraorbital ridge involving the preseptal soft tissues of the right orbit. Swelling of the right upper eyelid. No postseptal orbital involvement. No acute fracture or traumatic malalignment of the cervical spine. Large left thyroid nodule measuring up to 4.4 cm. Recommend nonemergent thyroid ultrasound for further evaluation. Electronically Signed   By: Emily Filbert M.D.   On: 01/13/2024 12:30   CT Maxillofacial Wo Contrast Result Date: 01/13/2024 CLINICAL DATA:  Trauma to head and neck. EXAM: CT HEAD WITHOUT CONTRAST CT MAXILLOFACIAL WITHOUT CONTRAST CT CERVICAL SPINE WITHOUT CONTRAST TECHNIQUE: Multidetector CT imaging of the head, cervical spine, and maxillofacial structures were performed using the standard protocol without intravenous contrast. Multiplanar CT image reconstructions of the cervical spine and maxillofacial structures were also generated. RADIATION DOSE REDUCTION: This exam was performed according to the departmental dose-optimization program which includes automated exposure control, adjustment of the mA and/or kV according to patient size and/or use of iterative reconstruction technique. COMPARISON:  None Available. FINDINGS: CT HEAD FINDINGS Brain: No acute intracranial hemorrhage. No CT evidence of acute infarct. Nonspecific hypoattenuation in the periventricular and subcortical white matter favored to reflect chronic microvascular ischemic changes. Mild generalized parenchymal volume loss. No edema, mass effect, or midline shift. The basilar cisterns are patent. Ventricles: Prominence of the ventricles suggesting underlying parenchymal volume loss. Vascular: Atherosclerotic calcifications of the carotid siphons. No hyperdense vessel. Skull: No acute or aggressive finding. Other: Mastoid air cells are clear. CT MAXILLOFACIAL  FINDINGS Osseous: Maxilla: Intact Pterygoid Plates: Intact Zygomatic Arch: Intact Orbits: Intact Ethmoid: Intact Sphenoid: Intact Frontal:Intact Mandible: Intact. No condylar dislocation. Nasal: Intact Nasal Septum: Intact.  Leftward bowing of the nasal septum. Orbits: Globes are intact. Bilateral lens replacement. Extraocular muscles and optic nerve sheath complexes are unremarkable. No retrobulbar hematoma. Soft tissue swelling over the right forehead extending over the supraorbital ridge into the right preseptal soft tissues with thickening of the right upper eyelid. Left preseptal soft tissues are unremarkable. Sinuses: Mucosal thickening and mucous retention cyst in the right maxillary sinus. Additional mucosal thickening in the alveolar recess of the left maxillary sinus. Moderate scattered mucosal thickening in the ethmoid sinuses slightly greater on the left. Soft tissues: Soft tissue swelling in the right aspect of the forehead extending over the supraorbital ridge in involving the right preseptal soft tissues. Superficial facial soft tissues are otherwise unremarkable. Visualized deep spaces of the neck without acute abnormality. CT CERVICAL SPINE FINDINGS Alignment: Alignment is maintained. No listhesis. No facet subluxation or dislocation. Mild dextrocurvature of the cervical and upper thoracic spine. Skull base and vertebrae: No compression fracture or displaced fracture in the cervical spine. Soft tissues and spinal canal: No prevertebral fluid or swelling. No visible canal hematoma. Large left thyroid nodule measuring 4.4 x 3.8 cm. Disc levels: Moderate disc space narrowing at multiple levels. Disc osteophyte complexes most pronounced at C4-5 and C6-7. No high-grade osseous spinal canal stenosis. Facet arthrosis at multiple levels. Significant foraminal narrowing at  multiple levels. Upper chest: Negative. Other: None. IMPRESSION: No CT evidence of acute intracranial abnormality. No acute maxillofacial  fracture. Right forehead hematoma extending over the supraorbital ridge involving the preseptal soft tissues of the right orbit. Swelling of the right upper eyelid. No postseptal orbital involvement. No acute fracture or traumatic malalignment of the cervical spine. Large left thyroid nodule measuring up to 4.4 cm. Recommend nonemergent thyroid ultrasound for further evaluation. Electronically Signed   By: Emily Filbert M.D.   On: 01/13/2024 12:30   CT Cervical Spine Wo Contrast Result Date: 01/13/2024 CLINICAL DATA:  Trauma to head and neck. EXAM: CT HEAD WITHOUT CONTRAST CT MAXILLOFACIAL WITHOUT CONTRAST CT CERVICAL SPINE WITHOUT CONTRAST TECHNIQUE: Multidetector CT imaging of the head, cervical spine, and maxillofacial structures were performed using the standard protocol without intravenous contrast. Multiplanar CT image reconstructions of the cervical spine and maxillofacial structures were also generated. RADIATION DOSE REDUCTION: This exam was performed according to the departmental dose-optimization program which includes automated exposure control, adjustment of the mA and/or kV according to patient size and/or use of iterative reconstruction technique. COMPARISON:  None Available. FINDINGS: CT HEAD FINDINGS Brain: No acute intracranial hemorrhage. No CT evidence of acute infarct. Nonspecific hypoattenuation in the periventricular and subcortical white matter favored to reflect chronic microvascular ischemic changes. Mild generalized parenchymal volume loss. No edema, mass effect, or midline shift. The basilar cisterns are patent. Ventricles: Prominence of the ventricles suggesting underlying parenchymal volume loss. Vascular: Atherosclerotic calcifications of the carotid siphons. No hyperdense vessel. Skull: No acute or aggressive finding. Other: Mastoid air cells are clear. CT MAXILLOFACIAL FINDINGS Osseous: Maxilla: Intact Pterygoid Plates: Intact Zygomatic Arch: Intact Orbits: Intact Ethmoid: Intact  Sphenoid: Intact Frontal:Intact Mandible: Intact. No condylar dislocation. Nasal: Intact Nasal Septum: Intact.  Leftward bowing of the nasal septum. Orbits: Globes are intact. Bilateral lens replacement. Extraocular muscles and optic nerve sheath complexes are unremarkable. No retrobulbar hematoma. Soft tissue swelling over the right forehead extending over the supraorbital ridge into the right preseptal soft tissues with thickening of the right upper eyelid. Left preseptal soft tissues are unremarkable. Sinuses: Mucosal thickening and mucous retention cyst in the right maxillary sinus. Additional mucosal thickening in the alveolar recess of the left maxillary sinus. Moderate scattered mucosal thickening in the ethmoid sinuses slightly greater on the left. Soft tissues: Soft tissue swelling in the right aspect of the forehead extending over the supraorbital ridge in involving the right preseptal soft tissues. Superficial facial soft tissues are otherwise unremarkable. Visualized deep spaces of the neck without acute abnormality. CT CERVICAL SPINE FINDINGS Alignment: Alignment is maintained. No listhesis. No facet subluxation or dislocation. Mild dextrocurvature of the cervical and upper thoracic spine. Skull base and vertebrae: No compression fracture or displaced fracture in the cervical spine. Soft tissues and spinal canal: No prevertebral fluid or swelling. No visible canal hematoma. Large left thyroid nodule measuring 4.4 x 3.8 cm. Disc levels: Moderate disc space narrowing at multiple levels. Disc osteophyte complexes most pronounced at C4-5 and C6-7. No high-grade osseous spinal canal stenosis. Facet arthrosis at multiple levels. Significant foraminal narrowing at multiple levels. Upper chest: Negative. Other: None. IMPRESSION: No CT evidence of acute intracranial abnormality. No acute maxillofacial fracture. Right forehead hematoma extending over the supraorbital ridge involving the preseptal soft tissues of  the right orbit. Swelling of the right upper eyelid. No postseptal orbital involvement. No acute fracture or traumatic malalignment of the cervical spine. Large left thyroid nodule measuring up to 4.4 cm. Recommend nonemergent thyroid ultrasound  for further evaluation. Electronically Signed   By: Emily Filbert M.D.   On: 01/13/2024 12:30    Procedures Procedures    Medications Ordered in ED Medications  bacitracin ointment (has no administration in time range)  acetaminophen (TYLENOL) tablet 1,000 mg (has no administration in time range)    ED Course/ Medical Decision Making/ A&P                                 Medical Decision Making Amount and/or Complexity of Data Reviewed Radiology: ordered.  Risk OTC drugs.     89 year old male with medical history significant for depression, hyperlipidemia, Mnire's disease, anxiety, CKD presenting to the emergency department with facial abrasions after a ground-level fall.  The patient states that prior to arrival he was walking a dog earlier this morning.  He tripped and fell forward landing on his face sustaining abrasions.  He denies loss of consciousness.  He is not on anticoagulation.  He sustained abrasions to his bilateral hands.  His tetanus is up-to-date as of 2023.  No nausea or vomiting.  He denies any other injuries or complaints.  He arrives GCS 15, ABC intact.  Jesse Hampton is a 89 y.o. male who presents by EMS as a nonlevel  Trauma patient after ground level mechanical fall as per above.  On arrival, vitals stable. Currently, he is awake, alert, and protecting his own airway and is hemodynamically stable.  Trauma imaging revealed (full reports in EMR): Portable CXR:  No evidence of pneumothorax or tracheal deviation CT Head, Face and C Spine: IMPRESSION:  No CT evidence of acute intracranial abnormality.    No acute maxillofacial fracture.    Right forehead hematoma extending over the supraorbital ridge   involving the preseptal soft tissues of the right orbit. Swelling of  the right upper eyelid. No postseptal orbital involvement.    No acute fracture or traumatic malalignment of the cervical spine.    Large left thyroid nodule measuring up to 4.4 cm. Recommend  nonemergent thyroid ultrasound for further evaluation.   CXR: IMPRESSION:  No acute or posttraumatic deformity identified.    Cardiomegaly without congestive failure.    The patient's wounds were cleaned by nursing staff and bacitracin ointment was applied.  Wound care instructions were provided.  Patient and family updated regarding the negative imaging evaluation, overall reassuring presentation.  Patient stable for discharge and outpatient follow-up at this time.   Final Clinical Impression(s) / ED Diagnoses Final diagnoses:  Fall, initial encounter  Abrasion of face, initial encounter  Traumatic hematoma of forehead, initial encounter  Abrasion of hand, unspecified laterality, initial encounter    Rx / DC Orders ED Discharge Orders     None         Ernie Avena, MD 01/13/24 1310
